# Patient Record
Sex: Male | Born: 1950
Health system: Southern US, Community
[De-identification: ages and names within clinical notes are randomized; demographics above are authoritative.]

## PROBLEM LIST (undated history)

## (undated) DIAGNOSIS — E785 Hyperlipidemia, unspecified: Secondary | ICD-10-CM

## (undated) DIAGNOSIS — E119 Type 2 diabetes mellitus without complications: Secondary | ICD-10-CM

## (undated) DIAGNOSIS — K7460 Unspecified cirrhosis of liver: Secondary | ICD-10-CM

## (undated) DIAGNOSIS — Z9889 Other specified postprocedural states: Secondary | ICD-10-CM

## (undated) DIAGNOSIS — E669 Obesity, unspecified: Secondary | ICD-10-CM

## (undated) DIAGNOSIS — E782 Mixed hyperlipidemia: Secondary | ICD-10-CM

## (undated) DIAGNOSIS — N171 Acute kidney failure with acute cortical necrosis: Secondary | ICD-10-CM

## (undated) DIAGNOSIS — Z45018 Encounter for adjustment and management of other part of cardiac pacemaker: Secondary | ICD-10-CM

## (undated) DIAGNOSIS — I252 Old myocardial infarction: Secondary | ICD-10-CM

## (undated) DIAGNOSIS — I10 Essential (primary) hypertension: Secondary | ICD-10-CM

## (undated) DIAGNOSIS — Z79899 Other long term (current) drug therapy: Secondary | ICD-10-CM

## (undated) DIAGNOSIS — I442 Atrioventricular block, complete: Secondary | ICD-10-CM

## (undated) DIAGNOSIS — K259 Gastric ulcer, unspecified as acute or chronic, without hemorrhage or perforation: Secondary | ICD-10-CM

## (undated) DIAGNOSIS — Z950 Presence of cardiac pacemaker: Secondary | ICD-10-CM

## (undated) DIAGNOSIS — I739 Peripheral vascular disease, unspecified: Secondary | ICD-10-CM

## (undated) DIAGNOSIS — I471 Supraventricular tachycardia: Secondary | ICD-10-CM

## (undated) DIAGNOSIS — E1142 Type 2 diabetes mellitus with diabetic polyneuropathy: Secondary | ICD-10-CM

## (undated) DIAGNOSIS — Z955 Presence of coronary angioplasty implant and graft: Secondary | ICD-10-CM

## (undated) DIAGNOSIS — A0472 Enterocolitis due to Clostridium difficile, not specified as recurrent: Secondary | ICD-10-CM

## (undated) DIAGNOSIS — M1990 Unspecified osteoarthritis, unspecified site: Secondary | ICD-10-CM

## (undated) DIAGNOSIS — R0602 Shortness of breath: Secondary | ICD-10-CM

## (undated) DIAGNOSIS — I2510 Atherosclerotic heart disease of native coronary artery without angina pectoris: Secondary | ICD-10-CM

## (undated) HISTORY — DX: Old myocardial infarction: I25.2

## (undated) HISTORY — DX: Mixed hyperlipidemia: E78.2

## (undated) HISTORY — DX: Supraventricular tachycardia: I47.1

## (undated) HISTORY — DX: Atherosclerotic heart disease of native coronary artery without angina pectoris: I25.10

## (undated) HISTORY — DX: Peripheral vascular disease, unspecified: I73.9

## (undated) HISTORY — PX: ABLATION OF DYSRHYTHMIC FOCUS: SHX254

## (undated) HISTORY — DX: Unspecified osteoarthritis, unspecified site: M19.90

## (undated) HISTORY — DX: Presence of coronary angioplasty implant and graft: Z95.5

## (undated) HISTORY — DX: Gastric ulcer, unspecified as acute or chronic, without hemorrhage or perforation: K25.9

## (undated) HISTORY — DX: Atrioventricular block, complete: I44.2

## (undated) HISTORY — DX: Shortness of breath: R06.02

## (undated) HISTORY — DX: Type 2 diabetes mellitus with diabetic polyneuropathy: E11.42

## (undated) HISTORY — PX: CORONARY ANGIOPLASTY WITH STENT PLACEMENT: SHX49

## (undated) HISTORY — DX: Essential (primary) hypertension: I10

## (undated) HISTORY — DX: Unspecified cirrhosis of liver: K74.60

## (undated) HISTORY — PX: CHOLECYSTECTOMY: SHX55

## (undated) HISTORY — DX: Other specified postprocedural states: Z98.890

## (undated) HISTORY — DX: Encounter for adjustment and management of other part of cardiac pacemaker: Z45.018

## (undated) HISTORY — DX: Acute kidney failure with acute cortical necrosis: N17.1

## (undated) HISTORY — DX: Obesity, unspecified: E66.9

## (undated) HISTORY — PX: TONSILLECTOMY: SUR1361

## (undated) HISTORY — DX: Type 2 diabetes mellitus without complications: E11.9

## (undated) HISTORY — DX: Hyperlipidemia, unspecified: E78.5

## (undated) HISTORY — DX: Presence of cardiac pacemaker: Z95.0

## (undated) HISTORY — DX: Enterocolitis due to Clostridium difficile, not specified as recurrent: A04.72

## (undated) HISTORY — DX: Other long term (current) drug therapy: Z79.899

---

## 1994-04-03 DIAGNOSIS — I252 Old myocardial infarction: Secondary | ICD-10-CM

## 1994-04-03 HISTORY — DX: Old myocardial infarction: I25.2

## 2015-04-19 DIAGNOSIS — I471 Supraventricular tachycardia, unspecified: Secondary | ICD-10-CM

## 2015-04-19 DIAGNOSIS — I739 Peripheral vascular disease, unspecified: Secondary | ICD-10-CM

## 2015-04-19 DIAGNOSIS — I2510 Atherosclerotic heart disease of native coronary artery without angina pectoris: Secondary | ICD-10-CM

## 2015-04-19 DIAGNOSIS — E785 Hyperlipidemia, unspecified: Secondary | ICD-10-CM

## 2015-04-19 HISTORY — DX: Peripheral vascular disease, unspecified: I73.9

## 2015-04-19 HISTORY — DX: Supraventricular tachycardia, unspecified: I47.10

## 2015-04-19 HISTORY — DX: Hyperlipidemia, unspecified: E78.5

## 2015-04-19 HISTORY — DX: Atherosclerotic heart disease of native coronary artery without angina pectoris: I25.10

## 2015-04-19 HISTORY — DX: Supraventricular tachycardia: I47.1

## 2015-05-02 DIAGNOSIS — E119 Type 2 diabetes mellitus without complications: Secondary | ICD-10-CM

## 2015-05-02 HISTORY — DX: Type 2 diabetes mellitus without complications: E11.9

## 2015-06-08 DIAGNOSIS — Z950 Presence of cardiac pacemaker: Secondary | ICD-10-CM

## 2015-06-08 HISTORY — DX: Presence of cardiac pacemaker: Z95.0

## 2015-06-09 DIAGNOSIS — Z9889 Other specified postprocedural states: Secondary | ICD-10-CM

## 2015-06-09 HISTORY — PX: ELECTROPHYSIOLOGIC STUDY: SHX172A

## 2015-06-09 HISTORY — DX: Other specified postprocedural states: Z98.890

## 2015-06-14 DIAGNOSIS — Z950 Presence of cardiac pacemaker: Secondary | ICD-10-CM

## 2015-06-14 HISTORY — DX: Presence of cardiac pacemaker: Z95.0

## 2015-06-14 HISTORY — PX: ATRIAL TACH ABLATION: EP1192

## 2015-06-28 DIAGNOSIS — I442 Atrioventricular block, complete: Secondary | ICD-10-CM

## 2015-06-28 DIAGNOSIS — Z45018 Encounter for adjustment and management of other part of cardiac pacemaker: Secondary | ICD-10-CM

## 2015-06-28 HISTORY — DX: Atrioventricular block, complete: I44.2

## 2015-06-28 HISTORY — DX: Encounter for adjustment and management of other part of cardiac pacemaker: Z45.018

## 2015-09-09 DIAGNOSIS — Z9889 Other specified postprocedural states: Secondary | ICD-10-CM | POA: Diagnosis not present

## 2015-09-09 DIAGNOSIS — I442 Atrioventricular block, complete: Secondary | ICD-10-CM | POA: Diagnosis not present

## 2015-09-09 DIAGNOSIS — Z45018 Encounter for adjustment and management of other part of cardiac pacemaker: Secondary | ICD-10-CM | POA: Diagnosis not present

## 2015-09-09 DIAGNOSIS — I2510 Atherosclerotic heart disease of native coronary artery without angina pectoris: Secondary | ICD-10-CM | POA: Diagnosis not present

## 2015-09-09 DIAGNOSIS — I471 Supraventricular tachycardia: Secondary | ICD-10-CM | POA: Diagnosis not present

## 2015-09-09 DIAGNOSIS — Z950 Presence of cardiac pacemaker: Secondary | ICD-10-CM | POA: Diagnosis not present

## 2015-10-29 DIAGNOSIS — I442 Atrioventricular block, complete: Secondary | ICD-10-CM | POA: Diagnosis not present

## 2015-10-29 DIAGNOSIS — Z45018 Encounter for adjustment and management of other part of cardiac pacemaker: Secondary | ICD-10-CM | POA: Diagnosis not present

## 2015-12-09 ENCOUNTER — Other Ambulatory Visit: Payer: Self-pay | Admitting: Unknown Physician Specialty

## 2015-12-09 DIAGNOSIS — K7460 Unspecified cirrhosis of liver: Secondary | ICD-10-CM

## 2015-12-16 ENCOUNTER — Ambulatory Visit
Admission: RE | Admit: 2015-12-16 | Discharge: 2015-12-16 | Disposition: A | Discharge status: Home / Self Care | Payer: PPO | Source: Ambulatory Visit | Attending: Unknown Physician Specialty | Admitting: Unknown Physician Specialty

## 2015-12-16 DIAGNOSIS — K7460 Unspecified cirrhosis of liver: Secondary | ICD-10-CM

## 2015-12-16 DIAGNOSIS — K7689 Other specified diseases of liver: Secondary | ICD-10-CM | POA: Diagnosis not present

## 2015-12-17 DIAGNOSIS — K7460 Unspecified cirrhosis of liver: Secondary | ICD-10-CM | POA: Diagnosis not present

## 2016-01-27 DIAGNOSIS — Z950 Presence of cardiac pacemaker: Secondary | ICD-10-CM | POA: Diagnosis not present

## 2016-02-03 DIAGNOSIS — K7460 Unspecified cirrhosis of liver: Secondary | ICD-10-CM | POA: Diagnosis not present

## 2016-02-03 DIAGNOSIS — R197 Diarrhea, unspecified: Secondary | ICD-10-CM | POA: Diagnosis not present

## 2016-02-07 DIAGNOSIS — R197 Diarrhea, unspecified: Secondary | ICD-10-CM | POA: Diagnosis not present

## 2016-02-07 DIAGNOSIS — K7460 Unspecified cirrhosis of liver: Secondary | ICD-10-CM | POA: Diagnosis not present

## 2016-02-10 DIAGNOSIS — R197 Diarrhea, unspecified: Secondary | ICD-10-CM | POA: Diagnosis not present

## 2016-03-01 DIAGNOSIS — R5381 Other malaise: Secondary | ICD-10-CM | POA: Diagnosis not present

## 2016-03-01 DIAGNOSIS — I471 Supraventricular tachycardia: Secondary | ICD-10-CM | POA: Diagnosis not present

## 2016-03-01 DIAGNOSIS — I2510 Atherosclerotic heart disease of native coronary artery without angina pectoris: Secondary | ICD-10-CM | POA: Diagnosis not present

## 2016-03-01 DIAGNOSIS — Z950 Presence of cardiac pacemaker: Secondary | ICD-10-CM | POA: Diagnosis not present

## 2016-03-01 DIAGNOSIS — E785 Hyperlipidemia, unspecified: Secondary | ICD-10-CM | POA: Diagnosis not present

## 2016-03-01 DIAGNOSIS — Z79899 Other long term (current) drug therapy: Secondary | ICD-10-CM

## 2016-03-01 HISTORY — DX: Other long term (current) drug therapy: Z79.899

## 2016-03-02 DIAGNOSIS — R197 Diarrhea, unspecified: Secondary | ICD-10-CM | POA: Diagnosis not present

## 2016-03-02 DIAGNOSIS — A047 Enterocolitis due to Clostridium difficile: Secondary | ICD-10-CM | POA: Diagnosis not present

## 2016-03-02 DIAGNOSIS — Z1211 Encounter for screening for malignant neoplasm of colon: Secondary | ICD-10-CM | POA: Diagnosis not present

## 2016-03-20 DIAGNOSIS — M109 Gout, unspecified: Secondary | ICD-10-CM | POA: Diagnosis not present

## 2016-04-27 DIAGNOSIS — Z9889 Other specified postprocedural states: Secondary | ICD-10-CM | POA: Diagnosis not present

## 2016-04-27 DIAGNOSIS — I471 Supraventricular tachycardia: Secondary | ICD-10-CM | POA: Diagnosis not present

## 2016-04-27 DIAGNOSIS — I739 Peripheral vascular disease, unspecified: Secondary | ICD-10-CM | POA: Diagnosis not present

## 2016-04-27 DIAGNOSIS — E119 Type 2 diabetes mellitus without complications: Secondary | ICD-10-CM | POA: Diagnosis not present

## 2016-04-27 DIAGNOSIS — Z45018 Encounter for adjustment and management of other part of cardiac pacemaker: Secondary | ICD-10-CM | POA: Diagnosis not present

## 2016-04-27 DIAGNOSIS — Z794 Long term (current) use of insulin: Secondary | ICD-10-CM | POA: Diagnosis not present

## 2016-04-27 DIAGNOSIS — I442 Atrioventricular block, complete: Secondary | ICD-10-CM | POA: Diagnosis not present

## 2016-04-29 DIAGNOSIS — Z45018 Encounter for adjustment and management of other part of cardiac pacemaker: Secondary | ICD-10-CM | POA: Diagnosis not present

## 2016-04-29 DIAGNOSIS — Z950 Presence of cardiac pacemaker: Secondary | ICD-10-CM | POA: Diagnosis not present

## 2016-05-01 DIAGNOSIS — R197 Diarrhea, unspecified: Secondary | ICD-10-CM | POA: Diagnosis not present

## 2016-05-01 DIAGNOSIS — K2950 Unspecified chronic gastritis without bleeding: Secondary | ICD-10-CM | POA: Diagnosis not present

## 2016-05-01 DIAGNOSIS — Z1211 Encounter for screening for malignant neoplasm of colon: Secondary | ICD-10-CM | POA: Diagnosis not present

## 2016-05-01 DIAGNOSIS — K2900 Acute gastritis without bleeding: Secondary | ICD-10-CM | POA: Diagnosis not present

## 2016-05-01 DIAGNOSIS — K7460 Unspecified cirrhosis of liver: Secondary | ICD-10-CM | POA: Diagnosis not present

## 2016-05-01 DIAGNOSIS — Z791 Long term (current) use of non-steroidal anti-inflammatories (NSAID): Secondary | ICD-10-CM | POA: Diagnosis not present

## 2016-05-19 DIAGNOSIS — Z01818 Encounter for other preprocedural examination: Secondary | ICD-10-CM | POA: Diagnosis not present

## 2016-05-19 DIAGNOSIS — Z950 Presence of cardiac pacemaker: Secondary | ICD-10-CM | POA: Diagnosis not present

## 2016-05-19 DIAGNOSIS — I471 Supraventricular tachycardia: Secondary | ICD-10-CM | POA: Diagnosis not present

## 2016-05-24 DIAGNOSIS — Z7984 Long term (current) use of oral hypoglycemic drugs: Secondary | ICD-10-CM | POA: Diagnosis not present

## 2016-05-24 DIAGNOSIS — E1142 Type 2 diabetes mellitus with diabetic polyneuropathy: Secondary | ICD-10-CM | POA: Diagnosis not present

## 2016-05-24 DIAGNOSIS — I471 Supraventricular tachycardia: Secondary | ICD-10-CM | POA: Diagnosis not present

## 2016-05-24 DIAGNOSIS — Z6834 Body mass index (BMI) 34.0-34.9, adult: Secondary | ICD-10-CM | POA: Diagnosis not present

## 2016-05-24 DIAGNOSIS — R002 Palpitations: Secondary | ICD-10-CM | POA: Diagnosis not present

## 2016-05-24 DIAGNOSIS — E1151 Type 2 diabetes mellitus with diabetic peripheral angiopathy without gangrene: Secondary | ICD-10-CM | POA: Diagnosis not present

## 2016-05-24 DIAGNOSIS — E785 Hyperlipidemia, unspecified: Secondary | ICD-10-CM | POA: Diagnosis not present

## 2016-05-24 DIAGNOSIS — R5383 Other fatigue: Secondary | ICD-10-CM | POA: Diagnosis not present

## 2016-05-24 DIAGNOSIS — I2510 Atherosclerotic heart disease of native coronary artery without angina pectoris: Secondary | ICD-10-CM | POA: Diagnosis not present

## 2016-05-24 DIAGNOSIS — R000 Tachycardia, unspecified: Secondary | ICD-10-CM | POA: Diagnosis not present

## 2016-05-24 DIAGNOSIS — Z7902 Long term (current) use of antithrombotics/antiplatelets: Secondary | ICD-10-CM | POA: Diagnosis not present

## 2016-05-24 DIAGNOSIS — E669 Obesity, unspecified: Secondary | ICD-10-CM | POA: Diagnosis not present

## 2016-05-24 DIAGNOSIS — I252 Old myocardial infarction: Secondary | ICD-10-CM | POA: Diagnosis not present

## 2016-05-24 DIAGNOSIS — Z7982 Long term (current) use of aspirin: Secondary | ICD-10-CM | POA: Diagnosis not present

## 2016-05-24 DIAGNOSIS — Z87891 Personal history of nicotine dependence: Secondary | ICD-10-CM | POA: Diagnosis not present

## 2016-05-24 DIAGNOSIS — Z950 Presence of cardiac pacemaker: Secondary | ICD-10-CM | POA: Diagnosis not present

## 2016-05-24 DIAGNOSIS — Z955 Presence of coronary angioplasty implant and graft: Secondary | ICD-10-CM | POA: Diagnosis not present

## 2016-05-24 HISTORY — PX: SUPRAVENTRICULAR TACHYCARDIA ABLATION: SHX6106

## 2016-05-25 DIAGNOSIS — Z950 Presence of cardiac pacemaker: Secondary | ICD-10-CM | POA: Diagnosis not present

## 2016-05-25 DIAGNOSIS — I739 Peripheral vascular disease, unspecified: Secondary | ICD-10-CM | POA: Diagnosis not present

## 2016-05-25 DIAGNOSIS — I471 Supraventricular tachycardia: Secondary | ICD-10-CM | POA: Diagnosis not present

## 2016-05-25 DIAGNOSIS — E119 Type 2 diabetes mellitus without complications: Secondary | ICD-10-CM | POA: Diagnosis not present

## 2016-06-12 DIAGNOSIS — K7460 Unspecified cirrhosis of liver: Secondary | ICD-10-CM | POA: Diagnosis not present

## 2016-07-03 DIAGNOSIS — Z45018 Encounter for adjustment and management of other part of cardiac pacemaker: Secondary | ICD-10-CM | POA: Diagnosis not present

## 2016-07-03 DIAGNOSIS — I2510 Atherosclerotic heart disease of native coronary artery without angina pectoris: Secondary | ICD-10-CM | POA: Diagnosis not present

## 2016-07-03 DIAGNOSIS — Z9889 Other specified postprocedural states: Secondary | ICD-10-CM | POA: Diagnosis not present

## 2016-07-03 DIAGNOSIS — I471 Supraventricular tachycardia: Secondary | ICD-10-CM | POA: Diagnosis not present

## 2016-08-01 DIAGNOSIS — Z950 Presence of cardiac pacemaker: Secondary | ICD-10-CM | POA: Diagnosis not present

## 2016-08-03 ENCOUNTER — Other Ambulatory Visit: Payer: Self-pay | Admitting: Unknown Physician Specialty

## 2016-08-03 DIAGNOSIS — K2901 Acute gastritis with bleeding: Secondary | ICD-10-CM | POA: Diagnosis not present

## 2016-08-03 DIAGNOSIS — K7460 Unspecified cirrhosis of liver: Secondary | ICD-10-CM | POA: Diagnosis not present

## 2016-08-11 ENCOUNTER — Other Ambulatory Visit: Payer: PPO

## 2016-08-12 DIAGNOSIS — J0100 Acute maxillary sinusitis, unspecified: Secondary | ICD-10-CM | POA: Diagnosis not present

## 2016-08-25 ENCOUNTER — Other Ambulatory Visit: Payer: PPO

## 2016-08-30 ENCOUNTER — Ambulatory Visit
Admission: RE | Admit: 2016-08-30 | Discharge: 2016-08-30 | Disposition: A | Discharge status: Home / Self Care | Payer: PPO | Source: Ambulatory Visit | Attending: Unknown Physician Specialty | Admitting: Unknown Physician Specialty

## 2016-08-30 DIAGNOSIS — K7460 Unspecified cirrhosis of liver: Secondary | ICD-10-CM

## 2016-09-06 DIAGNOSIS — I10 Essential (primary) hypertension: Secondary | ICD-10-CM | POA: Diagnosis not present

## 2016-09-06 DIAGNOSIS — I2510 Atherosclerotic heart disease of native coronary artery without angina pectoris: Secondary | ICD-10-CM | POA: Diagnosis not present

## 2016-09-06 DIAGNOSIS — Z950 Presence of cardiac pacemaker: Secondary | ICD-10-CM | POA: Diagnosis not present

## 2016-09-06 DIAGNOSIS — Z9889 Other specified postprocedural states: Secondary | ICD-10-CM | POA: Diagnosis not present

## 2016-09-06 DIAGNOSIS — Z79899 Other long term (current) drug therapy: Secondary | ICD-10-CM | POA: Diagnosis not present

## 2016-10-06 DIAGNOSIS — I10 Essential (primary) hypertension: Secondary | ICD-10-CM

## 2016-10-06 DIAGNOSIS — E782 Mixed hyperlipidemia: Secondary | ICD-10-CM | POA: Insufficient documentation

## 2016-10-06 DIAGNOSIS — E119 Type 2 diabetes mellitus without complications: Secondary | ICD-10-CM | POA: Diagnosis not present

## 2016-10-06 HISTORY — DX: Mixed hyperlipidemia: E78.2

## 2016-10-06 HISTORY — DX: Essential (primary) hypertension: I10

## 2016-11-01 DIAGNOSIS — I442 Atrioventricular block, complete: Secondary | ICD-10-CM | POA: Diagnosis not present

## 2016-11-01 DIAGNOSIS — Z45018 Encounter for adjustment and management of other part of cardiac pacemaker: Secondary | ICD-10-CM | POA: Diagnosis not present

## 2017-01-10 DIAGNOSIS — B352 Tinea manuum: Secondary | ICD-10-CM | POA: Diagnosis not present

## 2017-01-10 DIAGNOSIS — B356 Tinea cruris: Secondary | ICD-10-CM | POA: Diagnosis not present

## 2017-01-10 DIAGNOSIS — D1801 Hemangioma of skin and subcutaneous tissue: Secondary | ICD-10-CM | POA: Diagnosis not present

## 2017-01-10 DIAGNOSIS — L821 Other seborrheic keratosis: Secondary | ICD-10-CM | POA: Diagnosis not present

## 2017-01-31 DIAGNOSIS — Z950 Presence of cardiac pacemaker: Secondary | ICD-10-CM | POA: Diagnosis not present

## 2017-02-12 IMAGING — US US ABDOMEN COMPLETE
1 series · 14 of 25 positions shown · non-contrast
Comparison: CT abdomen pelvis of 06/23/2011

CLINICAL DATA: History of non alcoholic cirrhosis, prior
cholecystectomy

EXAM:
ABDOMEN ULTRASOUND COMPLETE

[Series 1: us abdomen complete · 0.35mm/px · 14 of 65 slices shown]
[im 1/65]
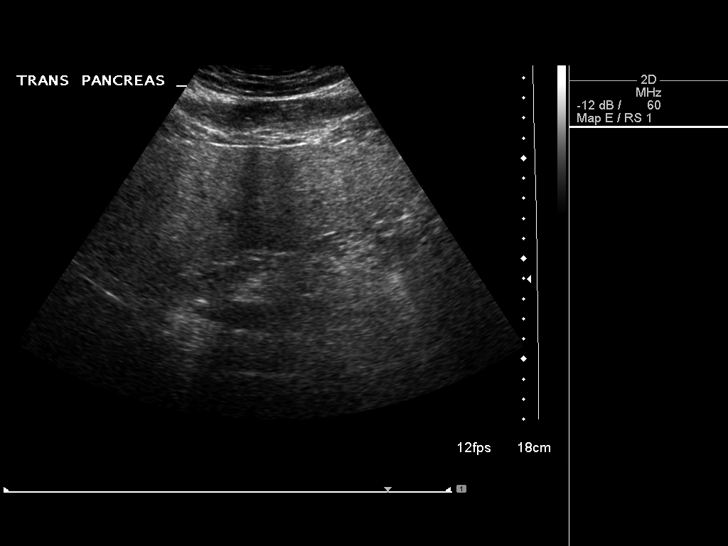
[im 6/65]
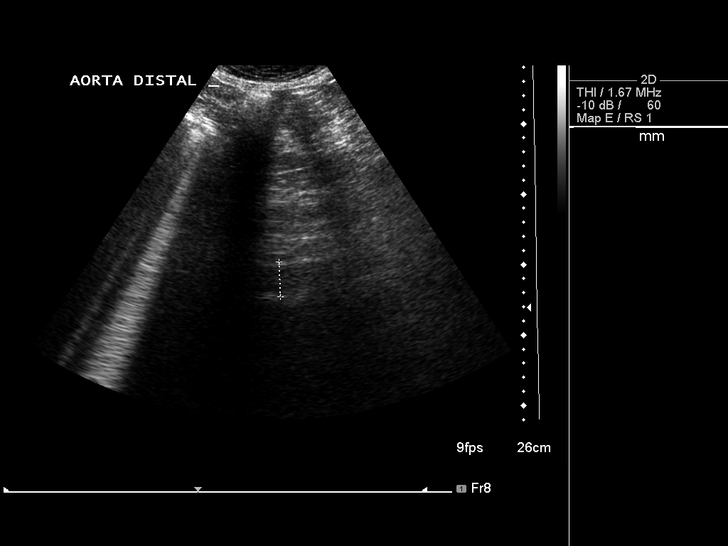
[im 11/65]
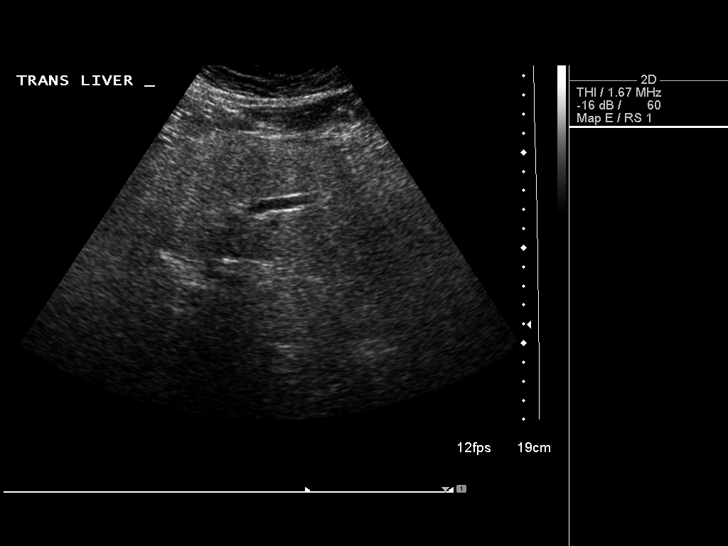
[im 17/65]
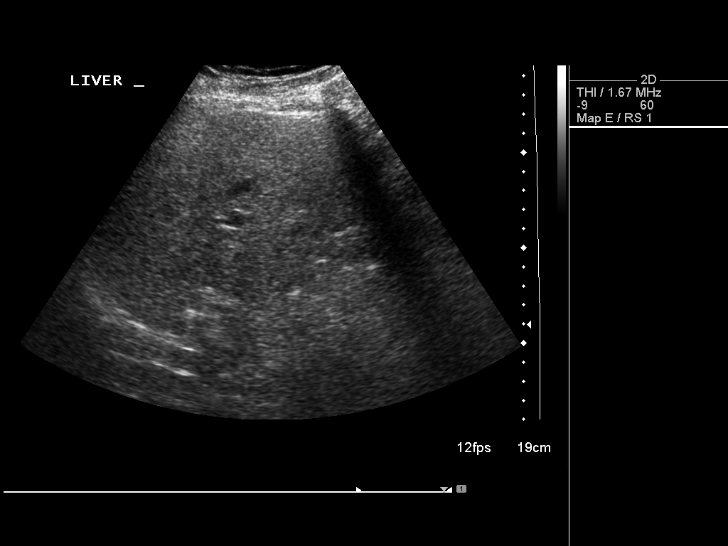
[im 22/65]
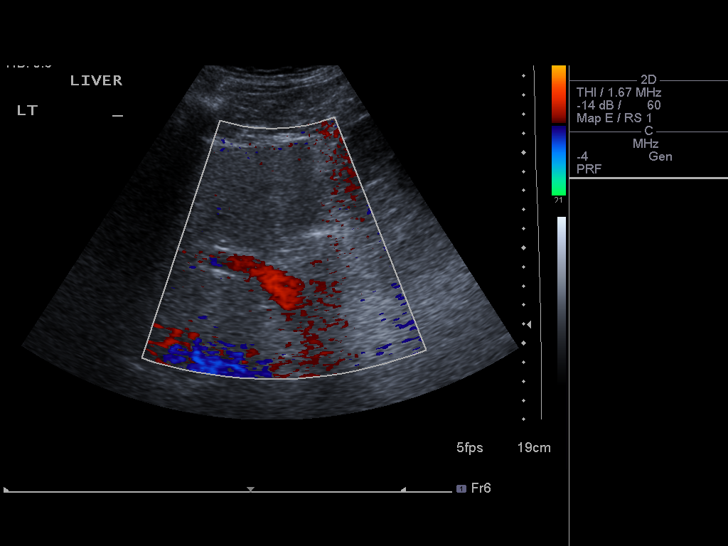
[im 25/65]
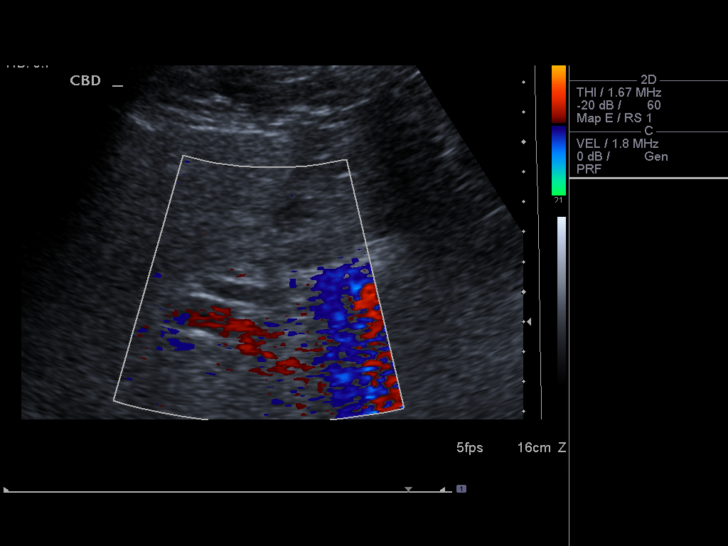
[im 30/65]
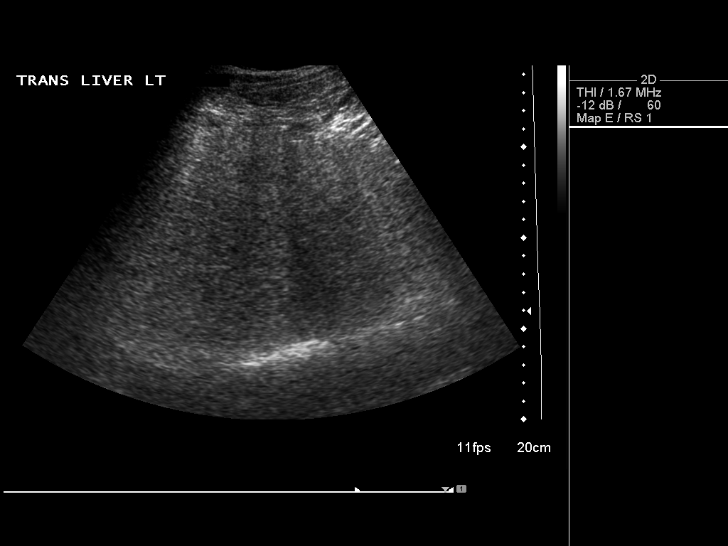
[im 35/65]
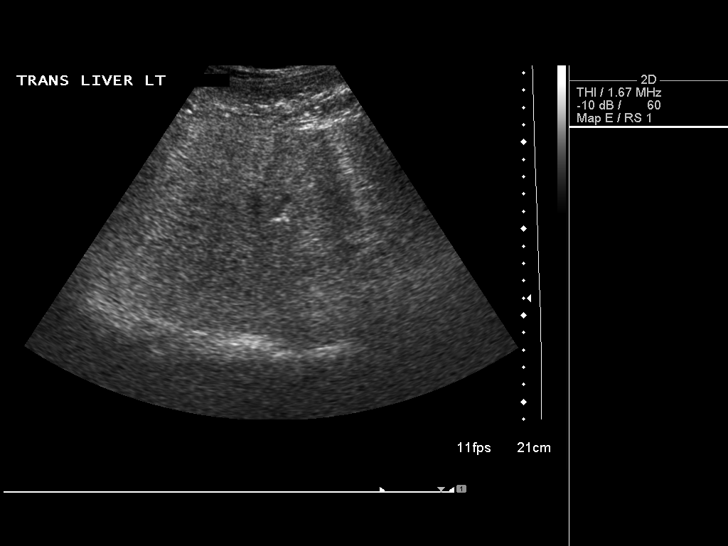
[im 41/65]
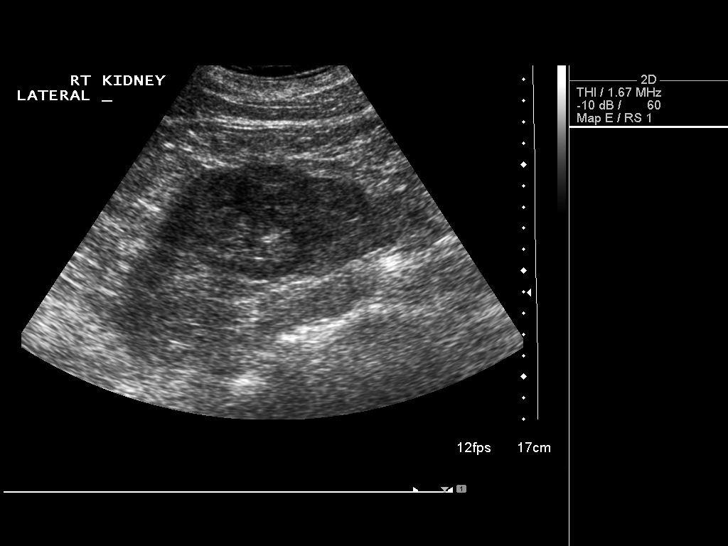
[im 43/65]
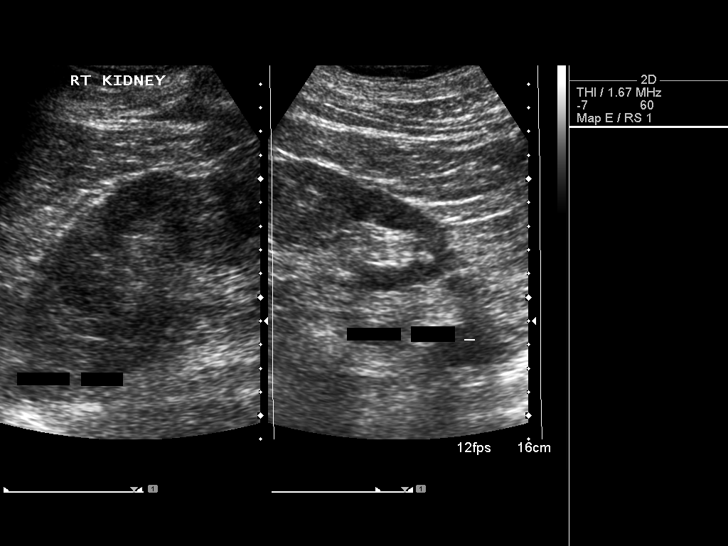
[im 49/65]
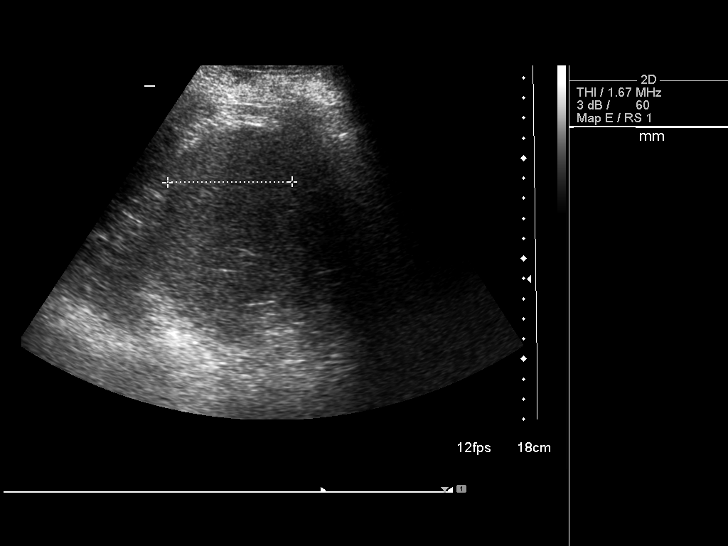
[im 54/65]
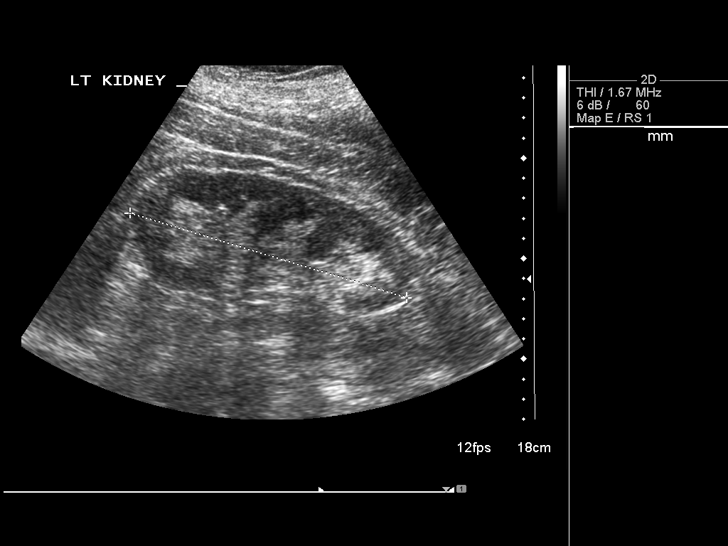
[im 59/65]
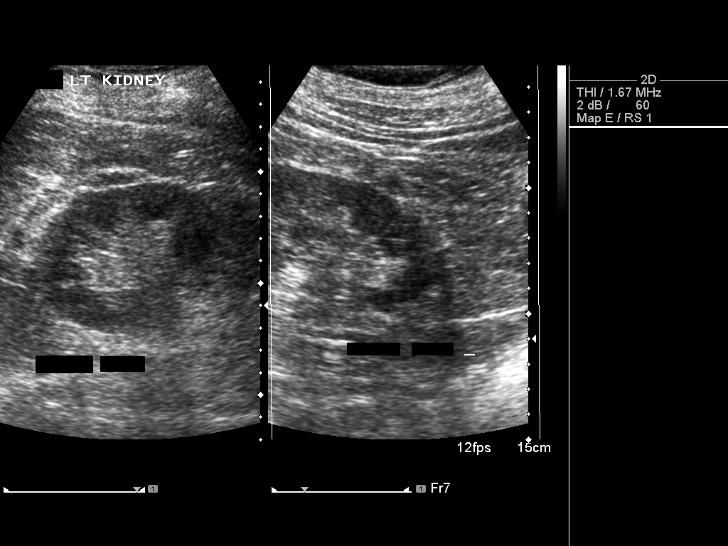
[im 65/65]
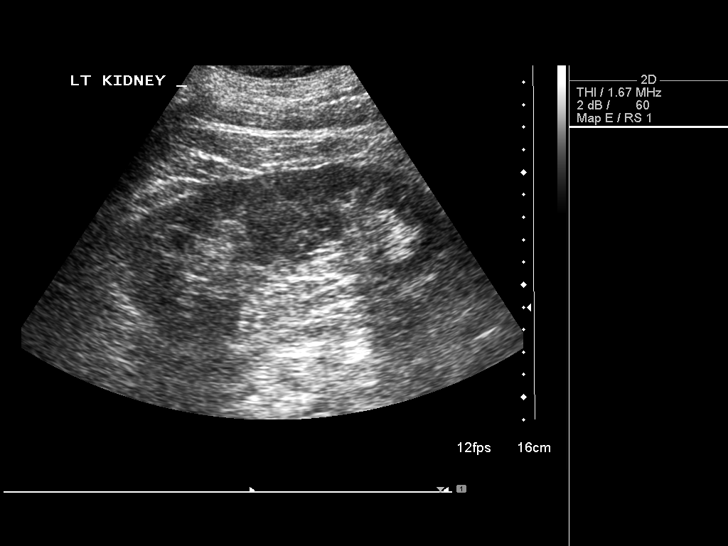

[14 of 25 positions shown; findings below may reference images not displayed]

FINDINGS: Gallbladder: The gallbladder has previously been resected.

Common bile duct: Diameter: The common bile duct is normal measuring
5.5 mm.

Liver: The liver is inhomogeneous and echogenic with nodular
contours consistent with changes of cirrhosis. No focal hepatic
abnormality is seen.

IVC: The IVC is largely obscured by bowel gas.

Pancreas: Much of the pancreas also is obscured by bowel gas and
body habitus.

Spleen: The spleen is normal measuring 6.2 cm.

Right Kidney: Length: 15.2 cm..  No hydronephrosis is seen.

Left Kidney: Length: 14.4 cm..  No hydronephrosis is noted.

Abdominal aorta: The abdominal aorta is largely obscured by bowel
gas and body habitus.

Other findings: None.
IMPRESSION: 1. Inhomogeneous echogenic liver parenchyma with nodular contours
consistent with changes of cirrhosis. No focal hepatic abnormality
is seen.
2. Much of the pancreas and abdominal aorta is obscured by bowel
gas.
3. No hydronephrosis.

## 2017-03-10 DIAGNOSIS — L122 Chronic bullous disease of childhood: Secondary | ICD-10-CM | POA: Diagnosis not present

## 2017-03-10 DIAGNOSIS — J029 Acute pharyngitis, unspecified: Secondary | ICD-10-CM | POA: Diagnosis not present

## 2017-04-11 ENCOUNTER — Other Ambulatory Visit: Payer: Self-pay | Admitting: Cardiology

## 2017-04-17 DIAGNOSIS — E119 Type 2 diabetes mellitus without complications: Secondary | ICD-10-CM | POA: Diagnosis not present

## 2017-04-17 DIAGNOSIS — E782 Mixed hyperlipidemia: Secondary | ICD-10-CM | POA: Diagnosis not present

## 2017-04-25 DIAGNOSIS — M1990 Unspecified osteoarthritis, unspecified site: Secondary | ICD-10-CM

## 2017-04-25 DIAGNOSIS — N171 Acute kidney failure with acute cortical necrosis: Secondary | ICD-10-CM

## 2017-04-25 DIAGNOSIS — A0472 Enterocolitis due to Clostridium difficile, not specified as recurrent: Secondary | ICD-10-CM | POA: Insufficient documentation

## 2017-04-25 DIAGNOSIS — Z955 Presence of coronary angioplasty implant and graft: Secondary | ICD-10-CM

## 2017-04-25 DIAGNOSIS — I10 Essential (primary) hypertension: Secondary | ICD-10-CM

## 2017-04-25 DIAGNOSIS — R0602 Shortness of breath: Secondary | ICD-10-CM

## 2017-04-25 DIAGNOSIS — K259 Gastric ulcer, unspecified as acute or chronic, without hemorrhage or perforation: Secondary | ICD-10-CM

## 2017-04-25 DIAGNOSIS — E1142 Type 2 diabetes mellitus with diabetic polyneuropathy: Secondary | ICD-10-CM

## 2017-04-25 DIAGNOSIS — E669 Obesity, unspecified: Secondary | ICD-10-CM

## 2017-04-25 DIAGNOSIS — K7460 Unspecified cirrhosis of liver: Secondary | ICD-10-CM

## 2017-04-25 HISTORY — DX: Unspecified osteoarthritis, unspecified site: M19.90

## 2017-04-25 HISTORY — DX: Gastric ulcer, unspecified as acute or chronic, without hemorrhage or perforation: K25.9

## 2017-04-25 HISTORY — DX: Shortness of breath: R06.02

## 2017-04-25 HISTORY — DX: Essential (primary) hypertension: I10

## 2017-04-25 HISTORY — DX: Presence of coronary angioplasty implant and graft: Z95.5

## 2017-04-25 HISTORY — DX: Type 2 diabetes mellitus with diabetic polyneuropathy: E11.42

## 2017-04-25 HISTORY — DX: Acute kidney failure with acute cortical necrosis: N17.1

## 2017-04-25 HISTORY — DX: Unspecified cirrhosis of liver: K74.60

## 2017-04-25 HISTORY — DX: Obesity, unspecified: E66.9

## 2017-04-25 HISTORY — DX: Enterocolitis due to Clostridium difficile, not specified as recurrent: A04.72

## 2017-05-02 DIAGNOSIS — Z950 Presence of cardiac pacemaker: Secondary | ICD-10-CM | POA: Diagnosis not present

## 2017-05-29 ENCOUNTER — Other Ambulatory Visit: Payer: Self-pay | Admitting: *Deleted

## 2017-06-18 ENCOUNTER — Ambulatory Visit: Payer: PPO | Admitting: Cardiology

## 2017-06-18 ENCOUNTER — Encounter: Payer: Self-pay | Admitting: Cardiology

## 2017-06-18 VITALS — BP 130/70 | HR 72 | Ht 73.0 in | Wt 240.0 lb

## 2017-06-18 DIAGNOSIS — E785 Hyperlipidemia, unspecified: Secondary | ICD-10-CM | POA: Diagnosis not present

## 2017-06-18 DIAGNOSIS — I739 Peripheral vascular disease, unspecified: Secondary | ICD-10-CM

## 2017-06-18 DIAGNOSIS — I10 Essential (primary) hypertension: Secondary | ICD-10-CM | POA: Diagnosis not present

## 2017-06-18 DIAGNOSIS — Z79899 Other long term (current) drug therapy: Secondary | ICD-10-CM | POA: Diagnosis not present

## 2017-06-18 DIAGNOSIS — I25118 Atherosclerotic heart disease of native coronary artery with other forms of angina pectoris: Secondary | ICD-10-CM

## 2017-06-18 DIAGNOSIS — Z950 Presence of cardiac pacemaker: Secondary | ICD-10-CM | POA: Diagnosis not present

## 2017-06-18 DIAGNOSIS — I471 Supraventricular tachycardia: Secondary | ICD-10-CM | POA: Diagnosis not present

## 2017-06-18 MED ORDER — CLOPIDOGREL BISULFATE 75 MG PO TABS: 75.0000 mg | ORAL_TABLET | Freq: Every day | ORAL | 3 refills | Status: DC

## 2017-06-18 MED ORDER — SOTALOL HCL 80 MG PO TABS: 80.0000 mg | ORAL_TABLET | Freq: Two times a day (BID) | ORAL | 3 refills | Status: DC

## 2017-06-18 MED ORDER — LISINOPRIL 10 MG PO TABS: 10.0000 mg | ORAL_TABLET | Freq: Every day | ORAL | 3 refills | Status: DC

## 2017-06-18 NOTE — Patient Instructions (Signed)
Medication Instructions:  Your physician recommends that you continue on your current medications as directed. Please refer to the Current Medication list given to you today.  Labwork: Your physician recommends that you return for lab work in: today. Lipid.  Testing/Procedures: None  Follow-Up: Your physician wants you to follow-up in: 1 year. You will receive a reminder letter in the mail two months in advance. If you don't receive a letter, please call our office to schedule the follow-up appointment.  Any Other Special Instructions Will Be Listed Below (If Applicable).     If you need a refill on your cardiac medications before your next appointment, please call your pharmacy.

## 2017-06-18 NOTE — Progress Notes (Signed)
Cardiology Office Note:    Date:  06/18/2017   ID:  Charles Gonzalez, DOB 1951-04-06, MRN [id number]  PCP:  Algis Greenhouse, MD  Cardiologist:  Shirlee More, MD    Referring MD: No ref. provider found    ASSESSMENT:    1. Coronary artery disease of native artery of native heart with stable angina pectoris ([place])   2. PSVT (paroxysmal supraventricular tachycardia) ([place])   3. High risk medication use   4. Presence of permanent cardiac pacemaker   5. Hyperlipidemia, unspecified hyperlipidemia type   6. Essential hypertension   7. PAD (peripheral artery disease) ([facility])    PLAN:    In order of problems listed above:  1. Stable,  2. Stable after EP catheter ablation continue low-dose sotalol for its beta-blocker effect QT interval is normal and EKG 3. Stable continue low-dose sotalol, at this dose it is predominantly beta-blocker is clinically effective 4. Stable function, he understands that his EP doctors in a different practice will continue pacemaker follow-up and management through my former group [facility] 5. Poorly controlled if agreeable start therapy with zetia if LDL remains greater than 70 add WelChol he will have a lipid profile performed today 6. Stable continue ACE inhibitor 7. Stable asymptomatic at this time I do not think he requires any imaging or diagnostic studies for his vascular disease peripheral   Next appointment: One year   Medication Adjustments/Labs and Tests Ordered: Current medicines are reviewed at length with the patient today.  Concerns regarding medicines are outlined above.  No orders of the defined types were placed in this encounter.  No orders of the defined types were placed in this encounter.   Chief Complaint  Patient presents with  . Follow-up    SVT and pacemaker  . Coronary Artery Disease    History of Present Illness:    Charles Gonzalez is a 66 y.o. male with a hx of CAD, SVT with bypass tract ablation 06/08/16 and on low dose sotolol ,  RBBB , Dyslipidemia with statin intolerance , HTN, Peripheral Vascular Disease with PCI of the LCIA in 2001 and a pacemaker  last seen in July 2017.Marland Kitchen Compliance with diet, lifestyle and medications: Yes Overall he is pleased with the quality of his life is not having claudication exercise intolerance chest pain shortness of breath palpitation syncope or TIA.  I engaged him in a conversation about lipids CAD and PAD and he is adamant he will not except statin therapy is a been intolerant of all statins in the past.  He is agreed to check his lipids and consider alternate therapy his LDL is greater than 100 and if he agrees will be started on Zetia and if his LDL remains above 70 WelChol can be added.  I offered antibody therapy PC SK 9 and he declined Past Medical History:  Diagnosis Date  . Acute kidney failure with acute cortical necrosis ([place]) 04/25/2017  . Arthritis 04/25/2017  . Benign hypertension 10/06/2016  . C. difficile colitis 04/25/2017   02/2016  . CAD (coronary artery disease), native coronary artery 04/19/2015   Overview:  PCI of RCA 1995  . Cirrhosis ([place]) 04/25/2017  . Gastric ulcer 04/25/2017  . High risk medication use 03/01/2016   Overview:  sotolol  . Hyperlipidemia 04/19/2015  . Hypertension 04/25/2017  . Mixed hyperlipidemia 10/06/2016   Overview:  HIS indicated based on CHD, declines statin, felt bad, declines PCSK9i  . Obesity 04/25/2017  . Old MI (myocardial infarction) 04/03/1994  .  Pacemaker 06/08/2015   Overview:  St Jude  . Pacemaker reprogramming/check 06/28/2015  . PAD (peripheral artery disease) ([place]) 04/19/2015   Overview:  PCI of L CIA 2001  . Polyneuropathy in diabetes ([place]) 04/25/2017  . Presence of permanent cardiac pacemaker 06/14/2015  . PSVT (paroxysmal supraventricular tachycardia) ([place]) 04/19/2015  . S/P ablation of accessory bypass tract 06/09/2015  . S/P coronary artery stent placement 04/25/2017   Overview:  Multiple  . Shortness of breath 04/25/2017    Overview:  exertional and with tachycardia  . Transient complete heart block ([place]) 06/28/2015   Overview:  pacemaker  . Type 2 diabetes mellitus without complication, without long-term current use of insulin ([place]) 05/02/2015   Overview:  1998: dx    Past Surgical History:  Procedure Laterality Date  . ABLATION OF DYSRHYTHMIC FOCUS    . ATRIAL TACH ABLATION  06/14/2015   Procedure: Atrial Tachycardia Ablation accessory pathway was close to HIS bundle, we have to approach this differently either from noncoronary cusp or left atrium NAVIX CRYO; Surgeon: Mahala Menghini, MD; Location: [facility] EP; Service: Cardiology  . CHOLECYSTECTOMY    . CORONARY ANGIOPLASTY WITH STENT PLACEMENT    . ELECTROPHYSIOLOGIC STUDY  06/09/2015   Procedure: Comprehensive Study W IND POSS PPM BTK; Surgeon: Mahala Menghini, MD; Location[facility] EP; Service: Cardiology   . SUPRAVENTRICULAR TACHYCARDIA ABLATION  05/24/2016   Procedure: Atrial Tachycardia Ablation esi; Surgeon: Mahala Menghini, MD; Location: [facility] EP; Service: Cardiology   . TONSILLECTOMY      Current Medications: Current Meds  Medication Sig  . aspirin EC 81 MG tablet Take 81 mg by mouth.  . clopidogrel (PLAVIX) 75 MG tablet TAKE ONE TABLET BY MOUTH EVERY DAY  . colchicine 0.6 MG tablet Take 0.6 mg by mouth.  Marland Kitchen glipiZIDE (GLUCOTROL XL) 5 MG 24 hr tablet Take 5 mg by mouth.  Marland Kitchen lisinopril (PRINIVIL,ZESTRIL) 10 MG tablet TAKE ONE TABLET BY MOUTH EVERY DAY  . metFORMIN (GLUCOPHAGE) 500 MG tablet Take 500 mg by mouth.  . sotalol (BETAPACE) 80 MG tablet Take 80 mg 2 (two) times daily by mouth.      Allergies:   Patient has no known allergies.   Social History   Socioeconomic History  . Marital status: Married    Spouse name: None  . Number of children: None  . Years of education: None  . Highest education level: None  Social Needs  . Financial resource strain: None  . Food insecurity - worry: None  . Food insecurity - inability: None  . Transportation needs -  medical: None  . Transportation needs - non-medical: None  Occupational History  . None  Tobacco Use  . Smoking status: Former Smoker    Years: 25.00    Last attempt to quit: 04/19/1994    Years since quitting: 23.1  . Smokeless tobacco: Never Used  Substance and Sexual Activity  . Alcohol use: No  . Drug use: No  . Sexual activity: None  Other Topics Concern  . None  Social History Narrative  . None     Family History: The patient's family history includes CAD in his mother; Hypertension in his father. ROS:   Please see the history of present illness.    All other systems reviewed and are negative.  EKGs/Labs/Other Studies Reviewed:    The following studies were reviewed today:  EKG:  EKG ordered today.  The ekg ordered today demonstrates Jennette and  RBBB unchanged from previous QTc  REMOTE MONITORING ASSESSMENT Date  of Transmission: January 30, 2017 Patient Name: Charles, Gonzalez July 29, 1951, 66 y.o. Following Provider: Mahala Menghini, MD Primary Care Provider: Raina Mina, MD Manufacturer of Device: St. Jude [employer] Type of Device: Dual Chamber Pacemaker Presenting Rhythm: AS VS @ 60bpm Percentage RV Pacing: 2.9% RV Paced Percentage Biventricular Pacing: Not Applicable Device Findings:  No Significant Atrial or Ventricular Arrhythmias  Battery Status Adequate Battery Voltage  Lead Trends Lead Trends Stable      Recent Labs: 04/17/17 A1c 7.1%, No results found for requested labs within last 8760 hours.  Recent Lipid Panel No results found for: CHOL, TRIG, HDL, CHOLHDL, VLDL, LDLCALC, LDLDIRECT  Physical Exam:    VS:  BP 130/70 (BP Location: Right Arm, Patient Position: Sitting, Cuff Size: Normal)   Pulse 72   Ht 6\' 1"  (1.854 m)   Wt 240 lb (108.9 kg)   SpO2 96%   BMI 31.66 kg/m     Wt Readings from Last 3 Encounters:  06/18/17 240 lb (108.9 kg)     GEN:  Well nourished, well developed in no acute distress HEENT: Normal NECK: No JVD; No carotid  bruits LYMPHATICS: No lymphadenopathy CARDIAC: RRR, no murmurs, rubs, gallops RESPIRATORY:  Clear to auscultation without rales, wheezing or rhonchi  ABDOMEN: Soft, non-tender, non-distended MUSCULOSKELETAL:  No edema; No deformity  SKIN: Warm and dry NEUROLOGIC:  Alert and oriented x 3 PSYCHIATRIC:  Normal affect    Signed, Shirlee More, MD  06/18/2017 9:23 AM    [place]

## 2017-06-19 LAB — LIPID PANEL
Chol/HDL Ratio: 5.2 ratio — ABNORMAL HIGH (ref 0.0–5.0)
Cholesterol, Total: 194 mg/dL (ref 100–199)
HDL: 37 mg/dL — AB (ref >39)
LDL CALC: 109 mg/dL — AB (ref 0–99)
Triglycerides: 241 mg/dL — ABNORMAL HIGH (ref 0–149)
VLDL CHOLESTEROL CAL: 48 mg/dL — AB (ref 5–40)

## 2017-06-22 ENCOUNTER — Other Ambulatory Visit: Payer: Self-pay

## 2017-06-22 MED ORDER — NITROGLYCERIN 0.4 MG SL SUBL: 0.4000 mg | SUBLINGUAL_TABLET | SUBLINGUAL | 11 refills | Status: DC | PRN

## 2017-08-01 DIAGNOSIS — Z950 Presence of cardiac pacemaker: Secondary | ICD-10-CM | POA: Diagnosis not present

## 2017-08-29 DIAGNOSIS — J039 Acute tonsillitis, unspecified: Secondary | ICD-10-CM | POA: Diagnosis not present

## 2017-10-19 DIAGNOSIS — Z0000 Encounter for general adult medical examination without abnormal findings: Secondary | ICD-10-CM | POA: Diagnosis not present

## 2017-10-30 DIAGNOSIS — Z950 Presence of cardiac pacemaker: Secondary | ICD-10-CM | POA: Diagnosis not present

## 2017-11-27 DIAGNOSIS — I442 Atrioventricular block, complete: Secondary | ICD-10-CM | POA: Diagnosis not present

## 2017-11-27 DIAGNOSIS — Z45018 Encounter for adjustment and management of other part of cardiac pacemaker: Secondary | ICD-10-CM | POA: Diagnosis not present

## 2018-02-26 DIAGNOSIS — Z950 Presence of cardiac pacemaker: Secondary | ICD-10-CM | POA: Diagnosis not present

## 2018-04-22 DIAGNOSIS — D2239 Melanocytic nevi of other parts of face: Secondary | ICD-10-CM | POA: Diagnosis not present

## 2018-04-22 DIAGNOSIS — L400 Psoriasis vulgaris: Secondary | ICD-10-CM | POA: Diagnosis not present

## 2018-05-28 DIAGNOSIS — Z950 Presence of cardiac pacemaker: Secondary | ICD-10-CM | POA: Diagnosis not present

## 2018-07-01 ENCOUNTER — Other Ambulatory Visit: Payer: Self-pay | Admitting: Cardiology

## 2018-07-02 ENCOUNTER — Other Ambulatory Visit: Payer: Self-pay | Admitting: Cardiology

## 2018-07-02 ENCOUNTER — Telehealth: Payer: Self-pay | Admitting: Cardiology

## 2018-07-02 NOTE — Telephone Encounter (Signed)
°*  STAT* If patient is at the pharmacy, call can be transferred to refill team.   1. Which medications need to be refilled? (please list name of each medication and dose if known)Lisinopril 10 takes one daily  2. Which pharmacy/location (including street and city if local pharmacy) is medication to be sent to?[facility]  3. Do they need a 30 day or 90 day supply? 90   *STAT* If patient is at the pharmacy, call can be transferred to refill team.   1. Which medications need to be refilled? (please list name of each medication and dose if known) Sotalol 80mg  takes 2 daily  2. Which pharmacy/location (including street and city if local pharmacy) is medication to be sent to[facility]  3. Do they need a 30 day or 90 day supply? [place]

## 2018-07-02 NOTE — Telephone Encounter (Signed)
60 day refill has been sent, patient is overdue for f/u

## 2018-08-01 DIAGNOSIS — R05 Cough: Secondary | ICD-10-CM | POA: Diagnosis not present

## 2018-08-01 DIAGNOSIS — J069 Acute upper respiratory infection, unspecified: Secondary | ICD-10-CM | POA: Diagnosis not present

## 2018-08-08 DIAGNOSIS — J324 Chronic pansinusitis: Secondary | ICD-10-CM | POA: Diagnosis not present

## 2018-08-20 NOTE — Progress Notes (Signed)
Cardiology Office Note:    Date:  08/21/2018   ID:  Charles Gonzalez, DOB 18-Apr-1951, MRN [id number]  PCP:  Algis Greenhouse, MD  Cardiologist:  Shirlee More, MD    Referring MD: Algis Greenhouse, MD    ASSESSMENT:    1. Coronary artery disease of native artery of native heart with stable angina pectoris ([place])   2. PAD (peripheral artery disease) ([place])   3. PSVT (paroxysmal supraventricular tachycardia) ([place])   4. Presence of permanent cardiac pacemaker   5. Hyperlipidemia, unspecified hyperlipidemia type   6. High risk medication use   7. Essential hypertension    PLAN:    In order of problems listed above:  1. Stable CAD continue medical therapy including long-term aspirin and clopidogrel with peripheral arterial disease.  He declines lipid-lowering therapy I offered him PCSK9. 2. Stable asymptomatic having no claudication continue current medical treatment strongly encouraged regular walking activity program 3. Stable no recurrence continue his antiarrhythmic drug 4. Stable monitor through [place] device [facility] 5. Poorly controlled he declines lipid-lowering therapy I asked him to reconsider and contact me if he changes his mind 6. Sotalol stable no evidence of proarrhythmia toxicity 7. Stable blood pressure target continue treatment including ACE inhibitor again I have no access to labs performed with the monitor at the [facility] hospital for lipids liver and renal function   Next appointment: 6 months   Medication Adjustments/Labs and Tests Ordered: Current medicines are reviewed at length with the patient today.  Concerns regarding medicines are outlined above.  No orders of the defined types were placed in this encounter.  No orders of the defined types were placed in this encounter.   Chief Complaint  Patient presents with  . Follow-up    pacemaker  . Coronary Artery Disease  . Hypertension  . Hyperlipidemia    History of Present Illness:    Charles Gonzalez is a 68 y.o. male with a hx of CAD, SVT with bypass tract ablation 06/08/16 and on low dose sotolol , RBBB , Dyslipidemia with statin intolerance , HTN, Peripheral Vascular Disease with PCI of the LCIA in 2001 and a pacemaker last seen 06/1217. Compliance with diet, lifestyle and medications: Yes  He is pleased with the quality of his life he has had no palpitation syncope chest pain shortness of breath edema or claudication.  He continues to follow with EP [facility] and is due in the next week.  He takes sotalol and has not had any side effects.  We discussed lipid-lowering therapy has been intolerant of all statins and he declines PCSK9 at this time.  He is having primary care and labs follow through the [facility] hospital and I am unable to access them. Past Medical History:  Diagnosis Date  . Acute kidney failure with acute cortical necrosis ([place]) 04/25/2017  . Arthritis 04/25/2017  . Benign hypertension 10/06/2016  . C. difficile colitis 04/25/2017   02/2016  . CAD (coronary artery disease), native coronary artery 04/19/2015   Overview:  PCI of RCA 1995  . Cirrhosis ([place]) 04/25/2017  . Gastric ulcer 04/25/2017  . High risk medication use 03/01/2016   Overview:  sotolol  . Hyperlipidemia 04/19/2015  . Hypertension 04/25/2017  . Mixed hyperlipidemia 10/06/2016   Overview:  HIS indicated based on CHD, declines statin, felt bad, declines PCSK9i  . Obesity 04/25/2017  . Old MI (myocardial infarction) 04/03/1994  . Pacemaker 06/08/2015   Overview:  St Jude  . Pacemaker  reprogramming/check 06/28/2015  . PAD (peripheral artery disease) ([place]) 04/19/2015   Overview:  PCI of L CIA 2001  . Polyneuropathy in diabetes ([place]) 04/25/2017  . Presence of permanent cardiac pacemaker 06/14/2015  . PSVT (paroxysmal supraventricular tachycardia) ([place]) 04/19/2015  . S/P ablation of accessory bypass tract 06/09/2015  . S/P coronary artery stent placement 04/25/2017   Overview:  Multiple  . Shortness of breath  04/25/2017   Overview:  exertional and with tachycardia  . Transient complete heart block ([place]) 06/28/2015   Overview:  pacemaker  . Type 2 diabetes mellitus without complication, without long-term current use of insulin ([place]) 05/02/2015   Overview:  1998: dx    Past Surgical History:  Procedure Laterality Date  . ABLATION OF DYSRHYTHMIC FOCUS    . ATRIAL TACH ABLATION  06/14/2015   Procedure: Atrial Tachycardia Ablation accessory pathway was close to HIS bundle, we have to approach this differently either from noncoronary cusp or left atrium NAVIX CRYO; Surgeon: Mahala Menghini, MD; Location: [facility] EP; Service: Cardiology  . CHOLECYSTECTOMY    . CORONARY ANGIOPLASTY WITH STENT PLACEMENT    . ELECTROPHYSIOLOGIC STUDY  06/09/2015   Procedure: Comprehensive Study W IND POSS PPM BTK; Surgeon: Mahala Menghini, MD; Location: [facility] EP; Service: Cardiology   . SUPRAVENTRICULAR TACHYCARDIA ABLATION  05/24/2016   Procedure: Atrial Tachycardia Ablation esi; Surgeon: Mahala Menghini, MD; Location: [facility] EP; Service: Cardiology   . TONSILLECTOMY      Current Medications: Current Meds  Medication Sig  . aspirin EC 81 MG tablet Take 81 mg by mouth.  . clopidogrel (PLAVIX) 75 MG tablet Take 1 tablet (75 mg total) daily by mouth.  . colchicine 0.6 MG tablet Take 0.6 mg by mouth.  Marland Kitchen glipiZIDE (GLUCOTROL XL) 5 MG 24 hr tablet Take 5 mg by mouth.  Marland Kitchen lisinopril (PRINIVIL,ZESTRIL) 10 MG tablet TAKE ONE TABLET BY MOUTH EVERY DAY  . metFORMIN (GLUCOPHAGE) 500 MG tablet Take 500 mg by mouth.  . nitroGLYCERIN (NITROSTAT) 0.4 MG SL tablet Place 1 tablet (0.4 mg total) every 5 (five) minutes as needed under the tongue for chest pain.  . sotalol (BETAPACE) 80 MG tablet TAKE ONE TABLET BY MOUTH TWICE DAILY     Allergies:   Patient has no known allergies.   Social History   Socioeconomic History  . Marital status: Married    Spouse name: Not on file  . Number of children: Not on file  . Years of education: Not on file  .  Highest education level: Not on file  Occupational History  . Not on file  Social Needs  . Financial resource strain: Not on file  . Food insecurity:    Worry: Not on file    Inability: Not on file  . Transportation needs:    Medical: Not on file    Non-medical: Not on file  Tobacco Use  . Smoking status: Former Smoker    Years: 25.00    Last attempt to quit: 04/19/1994    Years since quitting: 24.3  . Smokeless tobacco: Never Used  Substance and Sexual Activity  . Alcohol use: No  . Drug use: No  . Sexual activity: Not on file  Lifestyle  . Physical activity:    Days per week: Not on file    Minutes per session: Not on file  . Stress: Not on file  Relationships  . Social connections:    Talks on phone: Not on file    Gets together: Not on file  Attends religious service: Not on file    Active member of club or organization: Not on file    Attends meetings of clubs or organizations: Not on file    Relationship status: Not on file  Other Topics Concern  . Not on file  Social History Narrative  . Not on file     Family History: The patient's family history includes CAD in his mother; Hypertension in his father. ROS:   Please see the history of present illness.    All other systems reviewed and are negative.  EKGs/Labs/Other Studies Reviewed:    The following studies were reviewed today:  EKG:  EKG ordered today.  The ekg ordered today demonstrates sinus rhythm right bundle branch block QT interval corrected is normal 453  Recent Labs: No results found for requested labs within last 8760 hours.  Recent Lipid Panel    Component Value Date/Time   CHOL 194 06/18/2017 1000   TRIG 241 (H) 06/18/2017 1000   HDL 37 (L) 06/18/2017 1000   CHOLHDL 5.2 (H) 06/18/2017 1000   LDLCALC 109 (H) 06/18/2017 1000    Physical Exam:    VS:  BP 134/62 (BP Location: Right Arm, Patient Position: Sitting, Cuff Size: Normal)   Pulse 72   Ht 6\' 1"  (1.854 m)   Wt 233 lb 2 oz  (105.7 kg)   SpO2 94%   BMI 30.76 kg/m     Wt Readings from Last 3 Encounters:  08/21/18 233 lb 2 oz (105.7 kg)  06/18/17 240 lb (108.9 kg)     GEN:  Well nourished, well developed in no acute distress HEENT: Normal NECK: No JVD; No carotid bruits LYMPHATICS: No lymphadenopathy CARDIAC: RRR, no murmurs, rubs, gallops RESPIRATORY:  Clear to auscultation without rales, wheezing or rhonchi  ABDOMEN: Soft, non-tender, non-distended MUSCULOSKELETAL:  No edema; No deformity  SKIN: Warm and dry NEUROLOGIC:  Alert and oriented x 3 PSYCHIATRIC:  Normal affect    Signed, Shirlee More, MD  08/21/2018 9:36 AM    [place]

## 2018-08-21 ENCOUNTER — Encounter: Payer: Self-pay | Admitting: Cardiology

## 2018-08-21 ENCOUNTER — Ambulatory Visit (INDEPENDENT_AMBULATORY_CARE_PROVIDER_SITE_OTHER): Payer: PPO | Admitting: Cardiology

## 2018-08-21 VITALS — BP 134/62 | HR 72 | Ht 73.0 in | Wt 233.1 lb

## 2018-08-21 DIAGNOSIS — Z950 Presence of cardiac pacemaker: Secondary | ICD-10-CM

## 2018-08-21 DIAGNOSIS — I739 Peripheral vascular disease, unspecified: Secondary | ICD-10-CM | POA: Diagnosis not present

## 2018-08-21 DIAGNOSIS — E785 Hyperlipidemia, unspecified: Secondary | ICD-10-CM | POA: Diagnosis not present

## 2018-08-21 DIAGNOSIS — I25118 Atherosclerotic heart disease of native coronary artery with other forms of angina pectoris: Secondary | ICD-10-CM | POA: Diagnosis not present

## 2018-08-21 DIAGNOSIS — I10 Essential (primary) hypertension: Secondary | ICD-10-CM | POA: Diagnosis not present

## 2018-08-21 DIAGNOSIS — Z79899 Other long term (current) drug therapy: Secondary | ICD-10-CM

## 2018-08-21 DIAGNOSIS — I471 Supraventricular tachycardia: Secondary | ICD-10-CM

## 2018-08-21 NOTE — Patient Instructions (Signed)

## 2018-08-27 DIAGNOSIS — Z950 Presence of cardiac pacemaker: Secondary | ICD-10-CM | POA: Diagnosis not present

## 2018-09-07 ENCOUNTER — Other Ambulatory Visit: Payer: Self-pay | Admitting: Cardiology

## 2018-09-09 ENCOUNTER — Other Ambulatory Visit: Payer: Self-pay

## 2018-09-09 MED ORDER — LISINOPRIL 10 MG PO TABS: 10.0000 mg | ORAL_TABLET | Freq: Every day | ORAL | 1 refills | Status: DC

## 2018-10-15 DIAGNOSIS — Z955 Presence of coronary angioplasty implant and graft: Secondary | ICD-10-CM | POA: Diagnosis not present

## 2018-10-15 DIAGNOSIS — Z950 Presence of cardiac pacemaker: Secondary | ICD-10-CM | POA: Diagnosis not present

## 2018-10-15 DIAGNOSIS — Z45018 Encounter for adjustment and management of other part of cardiac pacemaker: Secondary | ICD-10-CM | POA: Diagnosis not present

## 2018-10-15 DIAGNOSIS — I10 Essential (primary) hypertension: Secondary | ICD-10-CM | POA: Diagnosis not present

## 2018-10-15 DIAGNOSIS — E782 Mixed hyperlipidemia: Secondary | ICD-10-CM | POA: Diagnosis not present

## 2018-10-15 DIAGNOSIS — I471 Supraventricular tachycardia: Secondary | ICD-10-CM | POA: Diagnosis not present

## 2018-10-15 DIAGNOSIS — E119 Type 2 diabetes mellitus without complications: Secondary | ICD-10-CM | POA: Diagnosis not present

## 2018-10-15 DIAGNOSIS — I739 Peripheral vascular disease, unspecified: Secondary | ICD-10-CM | POA: Diagnosis not present

## 2018-10-15 DIAGNOSIS — I2510 Atherosclerotic heart disease of native coronary artery without angina pectoris: Secondary | ICD-10-CM | POA: Diagnosis not present

## 2018-10-15 DIAGNOSIS — I442 Atrioventricular block, complete: Secondary | ICD-10-CM | POA: Diagnosis not present

## 2018-10-15 DIAGNOSIS — Z9889 Other specified postprocedural states: Secondary | ICD-10-CM | POA: Diagnosis not present

## 2019-01-14 DIAGNOSIS — Z950 Presence of cardiac pacemaker: Secondary | ICD-10-CM | POA: Diagnosis not present

## 2019-01-30 DIAGNOSIS — R21 Rash and other nonspecific skin eruption: Secondary | ICD-10-CM | POA: Diagnosis not present

## 2019-01-30 DIAGNOSIS — L309 Dermatitis, unspecified: Secondary | ICD-10-CM | POA: Diagnosis not present

## 2019-03-05 ENCOUNTER — Other Ambulatory Visit: Payer: Self-pay | Admitting: Cardiology

## 2019-03-05 DIAGNOSIS — B351 Tinea unguium: Secondary | ICD-10-CM | POA: Diagnosis not present

## 2019-03-05 DIAGNOSIS — B353 Tinea pedis: Secondary | ICD-10-CM | POA: Diagnosis not present

## 2019-03-05 DIAGNOSIS — B352 Tinea manuum: Secondary | ICD-10-CM | POA: Diagnosis not present

## 2019-04-15 DIAGNOSIS — Z950 Presence of cardiac pacemaker: Secondary | ICD-10-CM | POA: Diagnosis not present

## 2019-05-12 DIAGNOSIS — E1169 Type 2 diabetes mellitus with other specified complication: Secondary | ICD-10-CM | POA: Diagnosis not present

## 2019-05-12 DIAGNOSIS — E119 Type 2 diabetes mellitus without complications: Secondary | ICD-10-CM | POA: Diagnosis not present

## 2019-05-14 ENCOUNTER — Encounter: Payer: Self-pay | Admitting: Cardiology

## 2019-05-14 ENCOUNTER — Other Ambulatory Visit: Payer: Self-pay

## 2019-05-14 ENCOUNTER — Encounter: Payer: Self-pay | Admitting: *Deleted

## 2019-05-14 ENCOUNTER — Ambulatory Visit (INDEPENDENT_AMBULATORY_CARE_PROVIDER_SITE_OTHER): Payer: PPO | Admitting: Cardiology

## 2019-05-14 VITALS — BP 122/62 | HR 69 | Ht 73.0 in | Wt 241.0 lb

## 2019-05-14 DIAGNOSIS — I471 Supraventricular tachycardia: Secondary | ICD-10-CM

## 2019-05-14 DIAGNOSIS — E785 Hyperlipidemia, unspecified: Secondary | ICD-10-CM | POA: Diagnosis not present

## 2019-05-14 DIAGNOSIS — I252 Old myocardial infarction: Secondary | ICD-10-CM | POA: Diagnosis not present

## 2019-05-14 DIAGNOSIS — Z950 Presence of cardiac pacemaker: Secondary | ICD-10-CM

## 2019-05-14 DIAGNOSIS — I25118 Atherosclerotic heart disease of native coronary artery with other forms of angina pectoris: Secondary | ICD-10-CM | POA: Diagnosis not present

## 2019-05-14 DIAGNOSIS — I10 Essential (primary) hypertension: Secondary | ICD-10-CM

## 2019-05-14 NOTE — Progress Notes (Signed)
Cardiology Office Note:    Date:  05/14/2019   ID:  Charles Gonzalez, DOB 1951/01/24, MRN [id number]  PCP:  Algis Greenhouse, MD  Cardiologist:  Shirlee More, MD    Referring MD: Algis Greenhouse, MD    ASSESSMENT:    1. Coronary artery disease of native artery of native heart with stable angina pectoris ([place])   2. Hyperlipidemia, unspecified hyperlipidemia type    PLAN:    In order of problems listed above:  1. Stable CAD New York Heart Association class I after PCI no current medical treatment he will continue treatment including long-term dual antiplatelet therapy and his beta-blocker. 2. Stable dyslipidemia await labs if LDL greater than 100 PCSK9 be appropriate 3. SVT stable no recurrence continue sotalol 4. Pacemaker stable managed by [facility]   Next appointment: 6 months   Medication Adjustments/Labs and Tests Ordered: Current medicines are reviewed at length with the patient today.  Concerns regarding medicines are outlined above.  Orders Placed This Encounter  Procedures   EKG 12-Lead   No orders of the defined types were placed in this encounter.   Chief Complaint  Patient presents with   Follow-up    for CDL   Coronary Artery Disease    History of Present Illness:    Charles Gonzalez is a 68 y.o. male with a hx of CAD, SVT with bypass tract ablation 06/08/16 and on low dose sotolol , RBBB , Dyslipidemia with statin intolerance , HTN, Peripheral Vascular Disease with PCI of the LCIA in 2001 and a pacemaker followed by [facility]  last seen 08/21/2018. 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  He is doing well with heart disease has no angina is active no exercise intolerance edema shortness of breath palpitation or syncope and has his device followed by EP at [facility].  His EKG today is stable with right bundle branch block and normal QT  interval on sotalol.  Recent labs performed last week requested from [facility] urgent care. Past Medical History:  Diagnosis Date   Acute kidney failure with acute cortical necrosis ([place]) 04/25/2017   Arthritis 04/25/2017   Benign hypertension 10/06/2016   C. difficile colitis 04/25/2017   02/2016   CAD (coronary artery disease), native coronary artery 04/19/2015   Overview:  PCI of RCA 1995   Cirrhosis ([place]) 04/25/2017   Gastric ulcer 04/25/2017   High risk medication use 03/01/2016   Overview:  sotolol   Hyperlipidemia 04/19/2015   Hypertension 04/25/2017   Mixed hyperlipidemia 10/06/2016   Overview:  HIS indicated based on CHD, declines statin, felt bad, declines PCSK9i   Obesity 04/25/2017   Old MI (myocardial infarction) 04/03/1994   Pacemaker 06/08/2015   Overview:  St Jude   Pacemaker reprogramming/check 06/28/2015   PAD (peripheral artery disease) ([place]) 04/19/2015   Overview:  PCI of L CIA 2001   Polyneuropathy in diabetes ([place]) 04/25/2017   Presence of permanent cardiac pacemaker 06/14/2015   PSVT (paroxysmal supraventricular tachycardia) ([place]) 04/19/2015   S/P ablation of accessory bypass tract 06/09/2015   S/P coronary artery stent placement 04/25/2017   Overview:  Multiple   Shortness of breath 04/25/2017   Overview:  exertional and with tachycardia   Transient complete heart block ([place]) 06/28/2015   Overview:  pacemaker   Type 2 diabetes mellitus without complication, without long-term current use of insulin ([place]) 05/02/2015  Overview:  1998: dx    Past Surgical History:  Procedure Laterality Date   ABLATION OF DYSRHYTHMIC FOCUS     ATRIAL TACH ABLATION  06/14/2015   Procedure: Atrial Tachycardia Ablation accessory pathway was close to HIS bundle, we have to approach this differently either from noncoronary cusp or left atrium NAVIX CRYO; Surgeon: Mahala Menghini, MD; Location: [facility] EP; Service: Cardiology   CHOLECYSTECTOMY     CORONARY ANGIOPLASTY WITH STENT  PLACEMENT     ELECTROPHYSIOLOGIC STUDY  06/09/2015   Procedure: Comprehensive Study W IND POSS PPM BTK; Surgeon: Mahala Menghini, MD; Location: [facility] EP; Service: Cardiology    SUPRAVENTRICULAR TACHYCARDIA ABLATION  05/24/2016   Procedure: Atrial Tachycardia Ablation esi; Surgeon: Mahala Menghini, MD; Location: [facility] EP; Service: Cardiology    TONSILLECTOMY      Current Medications: Current Meds  Medication Sig   aspirin EC 81 MG tablet Take 81 mg by mouth.   clopidogrel (PLAVIX) 75 MG tablet TAKE ONE TABLET BY MOUTH EVERY DAY   colchicine 0.6 MG tablet Take 0.6 mg by mouth.   glipiZIDE (GLUCOTROL XL) 5 MG 24 hr tablet Take 5 mg by mouth.   lisinopril (ZESTRIL) 10 MG tablet TAKE ONE TABLET BY MOUTH ONCE DAILY   metFORMIN (GLUCOPHAGE) 500 MG tablet Take 500 mg by mouth.   nitroGLYCERIN (NITROSTAT) 0.4 MG SL tablet Place 1 tablet (0.4 mg total) every 5 (five) minutes as needed under the tongue for chest pain.   sotalol (BETAPACE) 80 MG tablet TAKE ONE TABLET BY MOUTH TWICE DAILY     Allergies:   Patient has no known allergies.   Social History   Socioeconomic History   Marital status: Married    Spouse name: Not on file   Number of children: Not on file   Years of education: Not on file   Highest education level: Not on file  Occupational History   Not on file  Social Needs   Financial resource strain: Not on file   Food insecurity    Worry: Not on file    Inability: Not on file   Transportation needs    Medical: Not on file    Non-medical: Not on file  Tobacco Use   Smoking status: Former Smoker    Years: 25.00    Quit date: 04/19/1994    Years since quitting: 25.0   Smokeless tobacco: Never Used  Substance and Sexual Activity   Alcohol use: No   Drug use: No   Sexual activity: Not on file  Lifestyle   Physical activity    Days per week: Not on file    Minutes per session: Not on file   Stress: Not on file  Relationships   Social connections     Talks on phone: Not on file    Gets together: Not on file    Attends religious service: Not on file    Active member of club or organization: Not on file    Attends meetings of clubs or organizations: Not on file    Relationship status: Not on file  Other Topics Concern   Not on file  Social History Narrative   Not on file     Family History: The patient's family history includes CAD in his mother; Hypertension in his father. ROS:   Please see the history of present illness.    All other systems reviewed and are negative.  EKGs/Labs/Other Studies Reviewed:    The following studies were reviewed today:  EKG:  EKG  ordered today and personally reviewed.  The ekg ordered today demonstrates sinus rhythm normal QT interval right bundle branch block  Recent Labs: No results found for requested labs within last 8760 hours.  Recent Lipid Panel    Component Value Date/Time   CHOL 194 06/18/2017 1000   TRIG 241 (H) 06/18/2017 1000   HDL 37 (L) 06/18/2017 1000   CHOLHDL 5.2 (H) 06/18/2017 1000   LDLCALC 109 (H) 06/18/2017 1000    Physical Exam:    VS:  BP 122/62 (BP Location: Right Arm, Patient Position: Sitting, Cuff Size: Large)    Pulse 69    Ht 6\' 1"  (1.854 m)    Wt 241 lb (109.3 kg)    SpO2 95%    BMI 31.80 kg/m     Wt Readings from Last 3 Encounters:  05/14/19 241 lb (109.3 kg)  08/21/18 233 lb 2 oz (105.7 kg)  06/18/17 240 lb (108.9 kg)     GEN:  Well nourished, well developed in no acute distress HEENT: Normal NECK: No JVD; No carotid bruits LYMPHATICS: No lymphadenopathy CARDIAC: RRR, no murmurs, rubs, gallops RESPIRATORY:  Clear to auscultation without rales, wheezing or rhonchi  ABDOMEN: Soft, non-tender, non-distended MUSCULOSKELETAL:  No edema; No deformity  SKIN: Warm and dry NEUROLOGIC:  Alert and oriented x 3 PSYCHIATRIC:  Normal affect    Signed, Shirlee More, MD  05/14/2019 4:03 PM    [facility]

## 2019-05-14 NOTE — Patient Instructions (Signed)
Medication Instructions:  Your physician recommends that you continue on your current medications as directed. Please refer to the Current Medication list given to you today.  If you need a refill on your cardiac medications before your next appointment, please call your pharmacy.   Lab work: None  **Your recent lab work from [facility] has been requested for Dr. Bettina Gavia to review.   If you have labs (blood work) drawn today and your tests are completely normal, you will receive your results only by: Marland Kitchen MyChart Message (if you have MyChart) OR . A paper copy in the mail If you have any lab test that is abnormal or we need to change your treatment, we will call you to review the results.  Testing/Procedures: You had an EKG today.   Follow-Up: At [facility], you and your health needs are our priority.  As part of our continuing mission to provide you with exceptional heart care, we have created designated Provider Care Teams.  These Care Teams include your primary Cardiologist (physician) and Advanced Practice Providers (APPs -  Physician Assistants and Nurse Practitioners) who all work together to provide you with the care you need, when you need it. You will need a follow up appointment in 6 months.  Please call our office 2 months in advance to schedule this appointment.

## 2019-05-15 NOTE — Addendum Note (Signed)
Addended by: Austin Miles on: 05/15/2019 04:42 PM   Modules accepted: Orders

## 2019-05-19 ENCOUNTER — Other Ambulatory Visit (HOSPITAL_COMMUNITY): Payer: PPO

## 2019-07-01 ENCOUNTER — Other Ambulatory Visit: Payer: Self-pay | Admitting: Cardiology

## 2019-07-02 NOTE — Telephone Encounter (Signed)
Ntg refill sent to [facility] [address] in [place] per request. Phoned and informed pts wife rx has been sent in as requested.

## 2019-07-02 NOTE — Telephone Encounter (Signed)
Please send nitro script in [facility] [address] in [place]. He lost his bottle so it out.

## 2019-07-10 ENCOUNTER — Telehealth: Payer: Self-pay | Admitting: Cardiology

## 2019-07-10 NOTE — Telephone Encounter (Signed)
Patient is wanting to know if he can take tylenol as needed for pain after his recent fall. Spoke with Dr. Bettina Gavia who advised patient should take no more than 6 regular tylenol pills daily. Patient also asked about high dose tylenol arthritis. Dr. Bettina Gavia advised to take no more than 2 pills twice daily of this medication. Patient is agreeable and verbalized understanding. No further questions.

## 2019-07-10 NOTE — Telephone Encounter (Signed)
Patient called and fell and wants to take something for pain for his ribs.Marland Kitchen

## 2019-07-15 DIAGNOSIS — Z950 Presence of cardiac pacemaker: Secondary | ICD-10-CM | POA: Diagnosis not present

## 2019-08-30 ENCOUNTER — Other Ambulatory Visit: Payer: Self-pay | Admitting: Cardiology

## 2019-10-01 DIAGNOSIS — I471 Supraventricular tachycardia: Secondary | ICD-10-CM | POA: Diagnosis not present

## 2019-10-01 DIAGNOSIS — Z950 Presence of cardiac pacemaker: Secondary | ICD-10-CM | POA: Diagnosis not present

## 2019-10-14 DIAGNOSIS — Z950 Presence of cardiac pacemaker: Secondary | ICD-10-CM | POA: Diagnosis not present

## 2019-10-21 DIAGNOSIS — Z9889 Other specified postprocedural states: Secondary | ICD-10-CM | POA: Diagnosis not present

## 2019-10-21 DIAGNOSIS — Z955 Presence of coronary angioplasty implant and graft: Secondary | ICD-10-CM | POA: Diagnosis not present

## 2019-10-21 DIAGNOSIS — I10 Essential (primary) hypertension: Secondary | ICD-10-CM | POA: Diagnosis not present

## 2019-10-21 DIAGNOSIS — E119 Type 2 diabetes mellitus without complications: Secondary | ICD-10-CM | POA: Diagnosis not present

## 2019-10-21 DIAGNOSIS — E782 Mixed hyperlipidemia: Secondary | ICD-10-CM | POA: Diagnosis not present

## 2019-10-21 DIAGNOSIS — I2510 Atherosclerotic heart disease of native coronary artery without angina pectoris: Secondary | ICD-10-CM | POA: Diagnosis not present

## 2019-10-21 DIAGNOSIS — I739 Peripheral vascular disease, unspecified: Secondary | ICD-10-CM | POA: Diagnosis not present

## 2019-10-21 DIAGNOSIS — I442 Atrioventricular block, complete: Secondary | ICD-10-CM | POA: Diagnosis not present

## 2019-10-21 DIAGNOSIS — Z950 Presence of cardiac pacemaker: Secondary | ICD-10-CM | POA: Diagnosis not present

## 2019-10-21 DIAGNOSIS — I471 Supraventricular tachycardia: Secondary | ICD-10-CM | POA: Diagnosis not present

## 2019-11-15 ENCOUNTER — Other Ambulatory Visit: Payer: Self-pay | Admitting: Cardiology

## 2020-01-13 DIAGNOSIS — Z950 Presence of cardiac pacemaker: Secondary | ICD-10-CM | POA: Diagnosis not present

## 2020-02-19 ENCOUNTER — Other Ambulatory Visit: Payer: Self-pay | Admitting: Cardiology

## 2020-04-26 DIAGNOSIS — Z45018 Encounter for adjustment and management of other part of cardiac pacemaker: Secondary | ICD-10-CM | POA: Diagnosis not present

## 2020-04-26 DIAGNOSIS — I442 Atrioventricular block, complete: Secondary | ICD-10-CM | POA: Diagnosis not present

## 2020-05-28 ENCOUNTER — Other Ambulatory Visit: Payer: Self-pay | Admitting: Cardiology

## 2020-07-26 DIAGNOSIS — Z950 Presence of cardiac pacemaker: Secondary | ICD-10-CM | POA: Diagnosis not present

## 2020-09-02 ENCOUNTER — Encounter: Payer: Self-pay | Admitting: Cardiology

## 2020-09-02 ENCOUNTER — Other Ambulatory Visit: Payer: Self-pay

## 2020-09-02 ENCOUNTER — Ambulatory Visit: Payer: PPO | Admitting: Cardiology

## 2020-09-02 VITALS — BP 124/64 | HR 64 | Ht 73.0 in | Wt 249.8 lb

## 2020-09-02 DIAGNOSIS — Z950 Presence of cardiac pacemaker: Secondary | ICD-10-CM | POA: Diagnosis not present

## 2020-09-02 DIAGNOSIS — I25118 Atherosclerotic heart disease of native coronary artery with other forms of angina pectoris: Secondary | ICD-10-CM | POA: Diagnosis not present

## 2020-09-02 DIAGNOSIS — I471 Supraventricular tachycardia: Secondary | ICD-10-CM

## 2020-09-02 DIAGNOSIS — I739 Peripheral vascular disease, unspecified: Secondary | ICD-10-CM

## 2020-09-02 DIAGNOSIS — E785 Hyperlipidemia, unspecified: Secondary | ICD-10-CM

## 2020-09-02 DIAGNOSIS — I10 Essential (primary) hypertension: Secondary | ICD-10-CM

## 2020-09-02 NOTE — Patient Instructions (Signed)

## 2020-09-02 NOTE — Progress Notes (Signed)
Cardiology Office Note:    Date:  09/02/2020   ID:  Charles Gonzalez, DOB Aug 22, 1950, MRN [id number]  PCP:  Algis Greenhouse, MD  Cardiologist:  Shirlee More, MD    Referring MD: Algis Greenhouse, MD    ASSESSMENT:    1. Coronary artery disease of native artery of native heart with stable angina pectoris ([place])   2. PSVT (paroxysmal supraventricular tachycardia) ([place])   3. Presence of permanent cardiac pacemaker   4. Hyperlipidemia, unspecified hyperlipidemia type   5. Essential hypertension   6. PAD (peripheral artery disease) ([facility])    PLAN:    In order of problems listed above:  1. Overall he has done well has no angina dual antiplatelet therapy with PAD and again declines lipid-lowering treatment.  I offered him a PCSK9 inhibitor. 2. SVT stable no recurrence permanent pacemaker not dependent followed by [facility] and continue low-dose sotalol 3. Poorly controlled hyperlipidemia declines additional drug therapy including PCSK9 inhibitor 4. Stable at target normal renal function continue lisinopril 5. Stable diabetes on Metformin   Next appointment: 6 Months   Medication Adjustments/Labs and Tests Ordered: Current medicines are reviewed at length with the patient today.  Concerns regarding medicines are outlined above.  Orders Placed This Encounter  Procedures  . EKG 12-Lead   No orders of the defined types were placed in this encounter.   Chief Complaint  Patient presents with  . Follow-up  . Coronary Artery Disease    History of Present Illness:    Charles Gonzalez is a 70 y.o. male with a hx of CAD SVT with bypass tract ablation 2017 and maintained on low-dose sotalol right bundle branch block dyslipidemia statin intolerance hypertension peripheral vascular disease with PCI of the left common iliac artery in 2001 and permanent pacemaker followed by [facility].  He was last seen 02/2019.  He continues to have his device followed and is compliant with  pacemaker evaluation.  He had recent labs at the [facility] hospital cholesterol 223 LDL 105 triglycerides 376 HDL 40 CMP was normal potassium 4.2 creatinine 1.10 normal liver function.  I mailed him a prescription for statin he has been intolerant in the past he refuses lipid-lowering therapy.  I offered him PCSK9 inhibitor and he again declines.  He tolerates sotalol has had no recurrent tachyarrhythmia.  He has been in the habit of walking every day and his dog died he has become sedentary and now he notices exercise intolerance.  He is not having claudication or chest pain.  We discussed an ischemia evaluation he declined he said he is going to get in a regular walking activity program and if the symptoms worsen become more typical he contact me and will need either cardiac perfusion study or referral to coronary angiography. Compliance with diet, lifestyle and medications: Yes Past Medical History:  Diagnosis Date  . Acute kidney failure with acute cortical necrosis ([place]) 04/25/2017  . Arthritis 04/25/2017  . Benign hypertension 10/06/2016  . C. difficile colitis 04/25/2017   02/2016  . CAD (coronary artery disease), native coronary artery 04/19/2015   Overview:  PCI of RCA 1995  . Cirrhosis ([place]) 04/25/2017  . Essential hypertension 04/25/2017  . Gastric ulcer 04/25/2017  . High risk medication use 03/01/2016   Overview:  sotolol  . Hyperlipidemia 04/19/2015  . Hypertension 04/25/2017  . Mixed hyperlipidemia 10/06/2016   Overview:  HIS indicated based on CHD, declines statin, felt bad, declines PCSK9i  . Obesity 04/25/2017  . Old  MI (myocardial infarction) 04/03/1994  . Pacemaker 06/08/2015   Overview:  St Jude  . Pacemaker reprogramming/check 06/28/2015  . PAD (peripheral artery disease) ([place]) 04/19/2015   Overview:  PCI of L CIA 2001  . Polyneuropathy in diabetes ([place]) 04/25/2017  . Presence of permanent cardiac pacemaker 06/14/2015  . PSVT (paroxysmal supraventricular tachycardia) ([place]) 04/19/2015  . S/P  ablation of accessory bypass tract 06/09/2015  . S/P coronary artery stent placement 04/25/2017   Overview:  Multiple  . Shortness of breath 04/25/2017   Overview:  exertional and with tachycardia  . Transient complete heart block ([place]) 06/28/2015   Overview:  pacemaker  . Type 2 diabetes mellitus without complication, without long-term current use of insulin ([place]) 05/02/2015   Overview:  1998: dx    Past Surgical History:  Procedure Laterality Date  . ABLATION OF DYSRHYTHMIC FOCUS    . ATRIAL TACH ABLATION  06/14/2015   Procedure: Atrial Tachycardia Ablation accessory pathway was close to HIS bundle, we have to approach this differently either from noncoronary cusp or left atrium NAVIX CRYO; Surgeon: Mahala Menghini, MD; Location: [facility] EP; Service: Cardiology  . CHOLECYSTECTOMY    . CORONARY ANGIOPLASTY WITH STENT PLACEMENT    . ELECTROPHYSIOLOGIC STUDY  06/09/2015   Procedure: Comprehensive Study W IND POSS PPM BTK; Surgeon: Mahala Menghini, MD; Location: [facility] EP; Service: Cardiology   . SUPRAVENTRICULAR TACHYCARDIA ABLATION  05/24/2016   Procedure: Atrial Tachycardia Ablation esi; Surgeon: Mahala Menghini, MD; Location: [facility] EP; Service: Cardiology   . TONSILLECTOMY      Current Medications: Current Meds  Medication Sig  . aspirin EC 81 MG tablet Take 81 mg by mouth 1 day or 1 dose.  . clopidogrel (PLAVIX) 75 MG tablet TAKE ONE TABLET BY MOUTH ONCE DAILY  . colchicine 0.6 MG tablet Take 0.6 mg by mouth.  Marland Kitchen glipiZIDE (GLUCOTROL XL) 5 MG 24 hr tablet Take 5 mg by mouth.  Marland Kitchen lisinopril (ZESTRIL) 10 MG tablet TAKE 1 TABLET BY MOUTH ONCE DAILY.  . metFORMIN (GLUCOPHAGE) 500 MG tablet Take 500 mg by mouth.  . nitroGLYCERIN (NITROSTAT) 0.4 MG SL tablet PLACE 1 TABLET UNDER THE TONGUE EVERY 5 MINUTES AS NEEDED FOR CHEST PAIN.  . sotalol (BETAPACE) 80 MG tablet TAKE ONE TABLET BY MOUTH TWICE DAILY     Allergies:   Patient has no known allergies.   Social History   Socioeconomic History  . Marital  status: Married    Spouse name: Not on file  . Number of children: Not on file  . Years of education: Not on file  . Highest education level: Not on file  Occupational History  . Not on file  Tobacco Use  . Smoking status: Former Smoker    Years: 25.00    Quit date: 04/19/1994    Years since quitting: 26.3  . Smokeless tobacco: Never Used  Vaping Use  . Vaping Use: Never used  Substance and Sexual Activity  . Alcohol use: No  . Drug use: No  . Sexual activity: Not on file  Other Topics Concern  . Not on file  Social History Narrative  . Not on file   Social Determinants of Health   Financial Resource Strain: Not on file  Food Insecurity: Not on file  Transportation Needs: Not on file  Physical Activity: Not on file  Stress: Not on file  Social Connections: Not on file     Family History: The patient's family history includes CAD in his mother; Hypertension in his  father. ROS:   Please see the history of present illness.    All other systems reviewed and are negative.  EKGs/Labs/Other Studies Reviewed:    The following studies were reviewed today:  EKG:  EKG ordered today and personally reviewed.  The ekg ordered today demonstrates sinus rhythm right bundle branch block  Recent Labs: No results found for requested labs within last 8760 hours.  Recent Lipid Panel    Component Value Date/Time   CHOL 194 06/18/2017 1000   TRIG 241 (H) 06/18/2017 1000   HDL 37 (L) 06/18/2017 1000   CHOLHDL 5.2 (H) 06/18/2017 1000   LDLCALC 109 (H) 06/18/2017 1000    Physical Exam:    VS:  BP 124/64   Pulse 64   Ht 6\' 1"  (1.854 m)   Wt 249 lb 12.8 oz (113.3 kg)   SpO2 98%   BMI 32.96 kg/m     Wt Readings from Last 3 Encounters:  09/02/20 249 lb 12.8 oz (113.3 kg)  05/14/19 241 lb (109.3 kg)  08/21/18 233 lb 2 oz (105.7 kg)     GEN:  Well nourished, well developed in no acute distress HEENT: Normal NECK: No JVD; No carotid bruits LYMPHATICS: No  lymphadenopathy CARDIAC: RRR, no murmurs, rubs, gallops RESPIRATORY:  Clear to auscultation without rales, wheezing or rhonchi  ABDOMEN: Soft, non-tender, non-distended MUSCULOSKELETAL:  No edema; No deformity  SKIN: Warm and dry NEUROLOGIC:  Alert and oriented x 3 PSYCHIATRIC:  Normal affect    Signed, Shirlee More, MD  09/02/2020 2:08 PM    [facility]

## 2020-09-07 ENCOUNTER — Telehealth: Payer: Self-pay | Admitting: Cardiology

## 2020-09-07 DIAGNOSIS — I252 Old myocardial infarction: Secondary | ICD-10-CM

## 2020-09-07 DIAGNOSIS — R079 Chest pain, unspecified: Secondary | ICD-10-CM

## 2020-09-07 NOTE — Telephone Encounter (Signed)
Charles Gonzalez is calling stating he has decided he would like to schedule the stress test Dr. Bettina Gavia discussed him having at his last appointment. He is requesting a callback to schedule once the order is placed. Please advise.

## 2020-09-08 ENCOUNTER — Telehealth: Payer: Self-pay

## 2020-09-08 NOTE — Telephone Encounter (Signed)
Set up [facility] office

## 2020-09-08 NOTE — Telephone Encounter (Signed)
Spoke with the patient, detailed instructions given. He stated that he would be here for his test. Asked to call back with any questions. S.Maxden Naji EMTP 

## 2020-09-08 NOTE — Telephone Encounter (Signed)
Spoke to the patient just now and let him know that I would put this order in for him. We went over the below instructions and he verbalizes understanding.   I will route this to Johnna to schedule the stress test in [place].    [facility] [address] [place], [place] [address] Phone:  [phone]    Please arrive 15 minutes prior to your appointment time for registration and insurance purposes.  The test will take approximately 3 to 4 hours to complete; you may bring reading material.  If someone comes with you to your appointment, they will need to remain in the main lobby due to limited space in the testing area. **If you are pregnant or breastfeeding, please notify the nuclear lab prior to your appointment**  How to prepare for your Myocardial Perfusion Test: . Do not eat or drink 3 hours prior to your test, except you may have water. . Do not consume products containing caffeine (regular or decaffeinated) 12 hours prior to your test. (ex: coffee, chocolate, sodas, tea). . Do bring a list of your current medications with you.  If not listed below, you may take your medications as normal.  . Hold your metformin and glipizide the morning of the test.  . Do wear comfortable clothes (no dresses or overalls) and walking shoes, tennis shoes preferred (No heels or open toe shoes are allowed). . Do NOT wear cologne, perfume, aftershave, or lotions (deodorant is allowed). . If these instructions are not followed, your test will have to be rescheduled.  Please report to [address] for your test.  If you have questions or concerns about your appointment, you can call the [facility] at [phone].  If you cannot keep your appointment, please provide 24 hours notification to the Nuclear Lab, to avoid a possible $50 charge to your account.

## 2020-09-08 NOTE — Addendum Note (Signed)
Addended by: Resa Miner I on: 09/08/2020 09:14 AM   Modules accepted: Orders

## 2020-09-09 ENCOUNTER — Telehealth: Payer: Self-pay

## 2020-09-09 NOTE — Addendum Note (Signed)
Addended byShirlee More on: 09/09/2020 10:17 AM   Modules accepted: Orders

## 2020-09-09 NOTE — Telephone Encounter (Signed)
Attestation order placed for lexiscan

## 2020-09-14 ENCOUNTER — Ambulatory Visit (INDEPENDENT_AMBULATORY_CARE_PROVIDER_SITE_OTHER): Payer: PPO

## 2020-09-14 ENCOUNTER — Telehealth: Payer: Self-pay

## 2020-09-14 ENCOUNTER — Other Ambulatory Visit: Payer: Self-pay

## 2020-09-14 DIAGNOSIS — R079 Chest pain, unspecified: Secondary | ICD-10-CM

## 2020-09-14 DIAGNOSIS — I252 Old myocardial infarction: Secondary | ICD-10-CM | POA: Diagnosis not present

## 2020-09-14 LAB — MYOCARDIAL PERFUSION IMAGING
LV dias vol: 99 mL (ref 62–150)
LV sys vol: 39 mL
Peak HR: 85 {beats}/min
Rest HR: 71 {beats}/min
SDS: 2
SRS: 7
SSS: 9
TID: 1.06

## 2020-09-14 MED ORDER — TECHNETIUM TC 99M TETROFOSMIN IV KIT
10.8000 | PACK | Freq: Once | INTRAVENOUS | Status: AC | PRN
  Administered 2020-09-14: 10.8 via INTRAVENOUS

## 2020-09-14 MED ORDER — REGADENOSON 0.4 MG/5ML IV SOLN
0.4000 mg | Freq: Once | INTRAVENOUS | Status: AC
  Administered 2020-09-14: 0.4 mg via INTRAVENOUS

## 2020-09-14 MED ORDER — TECHNETIUM TC 99M TETROFOSMIN IV KIT
30.4000 | PACK | Freq: Once | INTRAVENOUS | Status: AC | PRN
  Administered 2020-09-14: 30.4 via INTRAVENOUS

## 2020-09-14 NOTE — Telephone Encounter (Signed)
Left message on patients voicemail to please return our call.   

## 2020-09-14 NOTE — Telephone Encounter (Signed)
Spoke with patient regarding results and recommendation.  Patient verbalizes understanding and is agreeable to plan of care. Advised patient to call back with any issues or concerns.  

## 2020-09-14 NOTE — Telephone Encounter (Signed)
Patient is returning call.  °

## 2020-09-14 NOTE — Telephone Encounter (Signed)
-----   Message from Richardo Priest, MD sent at 09/14/2020  3:18 PM EST ----- We did this because Mr. Charles Gonzalez was a little concerned about his outlook in life with heart disease There is a good result normal cardiac function and no ischemia low risk test. I find this reassuring.

## 2020-10-25 DIAGNOSIS — Z950 Presence of cardiac pacemaker: Secondary | ICD-10-CM | POA: Diagnosis not present

## 2020-10-26 DIAGNOSIS — Z9889 Other specified postprocedural states: Secondary | ICD-10-CM | POA: Diagnosis not present

## 2020-10-26 DIAGNOSIS — I471 Supraventricular tachycardia: Secondary | ICD-10-CM | POA: Diagnosis not present

## 2020-10-26 DIAGNOSIS — Z45018 Encounter for adjustment and management of other part of cardiac pacemaker: Secondary | ICD-10-CM | POA: Diagnosis not present

## 2020-10-26 DIAGNOSIS — I2510 Atherosclerotic heart disease of native coronary artery without angina pectoris: Secondary | ICD-10-CM | POA: Diagnosis not present

## 2020-10-26 DIAGNOSIS — Z955 Presence of coronary angioplasty implant and graft: Secondary | ICD-10-CM | POA: Diagnosis not present

## 2020-10-26 DIAGNOSIS — I442 Atrioventricular block, complete: Secondary | ICD-10-CM | POA: Diagnosis not present

## 2020-10-26 DIAGNOSIS — Z79899 Other long term (current) drug therapy: Secondary | ICD-10-CM | POA: Diagnosis not present

## 2020-10-27 DIAGNOSIS — I4510 Unspecified right bundle-branch block: Secondary | ICD-10-CM | POA: Diagnosis not present

## 2020-11-01 DIAGNOSIS — S91341A Puncture wound with foreign body, right foot, initial encounter: Secondary | ICD-10-CM | POA: Diagnosis not present

## 2021-01-24 DIAGNOSIS — Z950 Presence of cardiac pacemaker: Secondary | ICD-10-CM | POA: Diagnosis not present

## 2021-01-26 ENCOUNTER — Other Ambulatory Visit: Payer: Self-pay | Admitting: Cardiology

## 2021-03-18 ENCOUNTER — Other Ambulatory Visit: Payer: Self-pay | Admitting: Cardiology

## 2021-04-25 ENCOUNTER — Other Ambulatory Visit: Payer: Self-pay | Admitting: Cardiology

## 2021-04-25 DIAGNOSIS — Z45018 Encounter for adjustment and management of other part of cardiac pacemaker: Secondary | ICD-10-CM | POA: Diagnosis not present

## 2021-06-23 ENCOUNTER — Other Ambulatory Visit: Payer: Self-pay

## 2021-06-23 ENCOUNTER — Ambulatory Visit: Payer: PPO | Admitting: Cardiology

## 2021-06-23 ENCOUNTER — Encounter: Payer: Self-pay | Admitting: Cardiology

## 2021-06-23 VITALS — BP 136/80 | HR 63 | Ht 73.0 in | Wt 242.4 lb

## 2021-06-23 DIAGNOSIS — E785 Hyperlipidemia, unspecified: Secondary | ICD-10-CM

## 2021-06-23 DIAGNOSIS — I739 Peripheral vascular disease, unspecified: Secondary | ICD-10-CM

## 2021-06-23 DIAGNOSIS — I471 Supraventricular tachycardia: Secondary | ICD-10-CM | POA: Diagnosis not present

## 2021-06-23 DIAGNOSIS — I10 Essential (primary) hypertension: Secondary | ICD-10-CM

## 2021-06-23 DIAGNOSIS — Z950 Presence of cardiac pacemaker: Secondary | ICD-10-CM

## 2021-06-23 DIAGNOSIS — I25118 Atherosclerotic heart disease of native coronary artery with other forms of angina pectoris: Secondary | ICD-10-CM

## 2021-06-23 NOTE — Progress Notes (Signed)
Cardiology Office Note:    Date:  06/23/2021   ID:  Charles Gonzalez, DOB 1950-08-13, MRN [id number]  PCP:  Algis Greenhouse, MD  Cardiologist:  Shirlee More, MD    Referring MD: Algis Greenhouse, MD    ASSESSMENT:    1. Coronary artery disease of native artery of native heart with stable angina pectoris ([place])   2. Essential hypertension   3. PSVT (paroxysmal supraventricular tachycardia) ([place])   4. Presence of permanent cardiac pacemaker   5. Hyperlipidemia, unspecified hyperlipidemia type   6. PAD (peripheral artery disease) (HCC)    PLAN:    In order of problems listed above:  Muadh continues to do well with his CAD and PAD having no angina or claudication after percutaneous revascularization of both processes he will continue his chronic dual antiplatelet therapy he is statin intolerant and has declined PCSK9 treatment.  At this time I would not do an ischemia evaluation Stable BP at target continue ACE inhibitor SVT stable no recurrence we will continue sotalol he is taking a subtherapeutic dose and mostly getting a beta-blocker effect Stable pacemaker function followed in device clinic [facility] supervise Dr. Adrian Prows Poorly controlled statin intolerant Stable having no claudication   Next appointment: 9 months   Medication Adjustments/Labs and Tests Ordered: Current medicines are reviewed at length with the patient today.  Concerns regarding medicines are outlined above.  Orders Placed This Encounter  Procedures   EKG 12-Lead   No orders of the defined types were placed in this encounter.   Chief Complaint  Patient presents with   Follow-up   Coronary Artery Disease   Tachycardia    \ History of Present Illness:    Charles Gonzalez is a 70 y.o. male with a hx of CAD with PCI of the right coronary artery in 1990 following SVT with bypass tract ablation 2017 and maintained on low-dose sotalol right bundle branch block dyslipidemia statin intolerance  hypertensive heart disease peripheral vascular disease with PCI of the left common iliac artery in 2001 and permanent pacemaker for bradycardia followed by [facility].  He was last seen 09/02/2020.  Compliance with diet, lifestyle and medications: Yes He is receiving most of his care through the [facility] hospital is in the process of disability with his ischemic heart disease/CAD and peripheral arterial disease and tells me has been seen by hematology for monoclonal gammopathy. Cardiology perspective doing well no rapid heart rhythm chest pain edema shortness of breath palpitation or syncope. He tolerates sotalol without side effect and has had no concerning arrhythmia on device telemetry follow through his electrophysiologist His pacemaker follow-up is through electrophysiology [facility] in [place] Past Medical History:  Diagnosis Date   Acute kidney failure with acute cortical necrosis ([place]) 04/25/2017   Arthritis 04/25/2017   Benign hypertension 10/06/2016   C. difficile colitis 04/25/2017   02/2016   CAD (coronary artery disease), native coronary artery 04/19/2015   Overview:  PCI of RCA 1995   Cirrhosis ([place]) 04/25/2017   Essential hypertension 04/25/2017   Gastric ulcer 04/25/2017   High risk medication use 03/01/2016   Overview:  sotolol   Hyperlipidemia 04/19/2015   Hypertension 04/25/2017   Mixed hyperlipidemia 10/06/2016   Overview:  HIS indicated based on CHD, declines statin, felt bad, declines PCSK9i   Obesity 04/25/2017   Old MI (myocardial infarction) 04/03/1994   Pacemaker 06/08/2015   Overview:  St Jude   Pacemaker reprogramming/check 06/28/2015   PAD (peripheral  artery disease) ([place]) 04/19/2015   Overview:  PCI of L CIA 2001   Polyneuropathy in diabetes ([place]) 04/25/2017   Presence of permanent cardiac pacemaker 06/14/2015   PSVT (paroxysmal supraventricular tachycardia) ([place]) 04/19/2015   S/P ablation of accessory bypass tract 06/09/2015   S/P coronary artery stent  placement 04/25/2017   Overview:  Multiple   Shortness of breath 04/25/2017   Overview:  exertional and with tachycardia   Transient complete heart block ([place]) 06/28/2015   Overview:  pacemaker   Type 2 diabetes mellitus without complication, without long-term current use of insulin ([place]) 05/02/2015   Overview:  1998: dx    Past Surgical History:  Procedure Laterality Date   ABLATION OF DYSRHYTHMIC FOCUS     ATRIAL TACH ABLATION  06/14/2015   Procedure: Atrial Tachycardia Ablation accessory pathway was close to HIS bundle, we have to approach this differently either from noncoronary cusp or left atrium NAVIX CRYO; Surgeon: Mahala Menghini, MD; Location: [facility] EP; Service: Cardiology   CHOLECYSTECTOMY     CORONARY ANGIOPLASTY WITH STENT PLACEMENT     ELECTROPHYSIOLOGIC STUDY  06/09/2015   Procedure: Comprehensive Study W IND POSS PPM BTK; Surgeon: Mahala Menghini, MD; Location: [facility] EP; Service: Cardiology    SUPRAVENTRICULAR TACHYCARDIA ABLATION  05/24/2016   Procedure: Atrial Tachycardia Ablation esi; Surgeon: Mahala Menghini, MD; Location: [facility] EP; Service: Cardiology    TONSILLECTOMY      Current Medications: Current Meds  Medication Sig   aspirin EC 81 MG tablet Take 81 mg by mouth 1 day or 1 dose.   clopidogrel (PLAVIX) 75 MG tablet TAKE 1 TABLET BY MOUTH ONCE DAILY.   colchicine 0.6 MG tablet Take 0.6 mg by mouth.   glipiZIDE (GLUCOTROL XL) 5 MG 24 hr tablet Take 5 mg by mouth.   lisinopril (ZESTRIL) 10 MG tablet TAKE ONE TABLET BY MOUTH EVERY DAY   metFORMIN (GLUCOPHAGE) 500 MG tablet Take 500 mg by mouth.   nitroGLYCERIN (NITROSTAT) 0.4 MG SL tablet TAKE 1 TABLET UNDER TONGUE AS NEEDED FORCHEST PAIN REPEAT IN 5 MINUTES IF NEEDED   sotalol (BETAPACE) 80 MG tablet TAKE ONE TABLET BY MOUTH TWICE DAILY     Allergies:   Atorvastatin and Glipizide   Social History   Socioeconomic History   Marital status: Married    Spouse name: Not on file   Number of children: Not on file   Years of  education: Not on file   Highest education level: Not on file  Occupational History   Not on file  Tobacco Use   Smoking status: Former    Years: 25.00    Types: Cigarettes    Quit date: 04/19/1994    Years since quitting: 27.1   Smokeless tobacco: Never  Vaping Use   Vaping Use: Never used  Substance and Sexual Activity   Alcohol use: No   Drug use: No   Sexual activity: Not on file  Other Topics Concern   Not on file  Social History Narrative   Not on file   Social Determinants of Health   Financial Resource Strain: Not on file  Food Insecurity: Not on file  Transportation Needs: Not on file  Physical Activity: Not on file  Stress: Not on file  Social Connections: Not on file     Family History: The patient's family history includes CAD in his mother; Hypertension in his father. ROS:   Please see the history of present illness.    All other systems reviewed and are negative.  EKGs/Labs/Other Studies Reviewed:    The following studies were reviewed today:  EKG:  EKG ordered today and personally reviewed.  The ekg ordered today demonstrates sinus rhythm right bundle branch block normal QT interval  Recent Labs: No results found for requested labs within last 8760 hours.  Recent Lipid Panel    Component Value Date/Time   CHOL 194 06/18/2017 1000   TRIG 241 (H) 06/18/2017 1000   HDL 37 (L) 06/18/2017 1000   CHOLHDL 5.2 (H) 06/18/2017 1000   LDLCALC 109 (H) 06/18/2017 1000    Physical Exam:    VS:  BP (!) 160/70   Pulse 63   Ht 6\' 1"  (1.854 m)   Wt 242 lb 6.4 oz (110 kg)   SpO2 97%   BMI 31.98 kg/m     Wt Readings from Last 3 Encounters:  06/23/21 242 lb 6.4 oz (110 kg)  09/14/20 249 lb (112.9 kg)  09/02/20 249 lb 12.8 oz (113.3 kg)     GEN:  Well nourished, well developed in no acute distress HEENT: Normal NECK: No JVD; No carotid bruits LYMPHATICS: No lymphadenopathy CARDIAC: RRR, no murmurs, rubs, gallops RESPIRATORY:  Clear to auscultation  without rales, wheezing or rhonchi  ABDOMEN: Soft, non-tender, non-distended MUSCULOSKELETAL:  No edema; No deformity  SKIN: Warm and dry NEUROLOGIC:  Alert and oriented x 3 PSYCHIATRIC:  Normal affect    Signed, Shirlee More, MD  06/23/2021 11:41 AM    [place]

## 2021-06-23 NOTE — Patient Instructions (Signed)

## 2021-07-07 ENCOUNTER — Other Ambulatory Visit: Payer: Self-pay | Admitting: Cardiology

## 2021-09-05 ENCOUNTER — Other Ambulatory Visit: Payer: Self-pay | Admitting: Cardiology

## 2022-03-01 ENCOUNTER — Other Ambulatory Visit: Payer: Self-pay | Admitting: Cardiology

## 2022-04-24 NOTE — Progress Notes (Unsigned)
Cardiology Office Note:    Date:  04/25/2022   ID:  Charles Gonzalez, DOB 12-05-1950, MRN [id number]  PCP:  [facility]  Cardiologist:  Shirlee More, MD    Referring MD: Algis Greenhouse, MD    ASSESSMENT:    1. Coronary artery disease of native artery of native heart with stable angina pectoris ([place])   2. PAD (peripheral artery disease) ([place])   3. Essential hypertension   4. PSVT (paroxysmal supraventricular tachycardia) ([place])   5. Presence of permanent cardiac pacemaker   6. Hyperlipidemia, unspecified hyperlipidemia type    PLAN:    In order of problems listed above:  Kelvis continues to do well with CAD having no angina after previous PCI and on current medical therapy we will continue his long-term dual antiplatelet therapy with coincident PAD. Stable having no claudication Blood pressure is at target continue current treatment Ace inhibitor No recurrence remains on sotalol check EKG for QT interval Stable pacemaker function followed in the [facility] atrium system by EP Poorly controlled I have offered every conceivable lipid-lowering therapy and he declines previous intolerances as his recent   Next appointment: 6 months   Medication Adjustments/Labs and Tests Ordered: Current medicines are reviewed at length with the patient today.  Concerns regarding medicines are outlined above.  No orders of the defined types were placed in this encounter.  No orders of the defined types were placed in this encounter.   Chief Complaint  Patient presents with   Coronary Artery Disease   SVT   RBBB   Hyperlipidemia   Hypertension   PAD    History of Present Illness:    Charles Gonzalez is a 71 y.o. male with a hx of CAD with PCI of the right coronary artery in 1990 following SVT with bypass tract ablation 2017 and maintained on low-dose sotalol right bundle branch block dyslipidemia statin intolerance hypertensive heart disease peripheral vascular disease with  PCI of the left common iliac artery in 2001 and permanent pacemaker for bradycardia last seen 08/23/2020.  Compliance with diet, lifestyle and medications: Yes   He is receiving [facility] at the [facility].  He is an ongoing determination of extent of agent orange disability He now has neuropathy he is having no claudication chest pain edema shortness of breath palpitation or syncope He has remote device check through [place] reviewed I offered again to attempt to treat him with lipid-lowering therapy and he declines.   He had pacemaker device check [facility] atrium 01/24/2022.  [facility] 02/27/2022 A1c 6.3% LDL cholesterol 122 creatinine 1.05 potassium 4.3 EKG 6 Past Medical History:  Diagnosis Date   Acute kidney failure with acute cortical necrosis ([place]) 04/25/2017   Arthritis 04/25/2017   Benign hypertension 10/06/2016   C. difficile colitis 04/25/2017   02/2016   CAD (coronary artery disease), native coronary artery 04/19/2015   Overview:  PCI of RCA 1995   Cirrhosis ([place]) 04/25/2017   Essential hypertension 04/25/2017   Gastric ulcer 04/25/2017   High risk medication use 03/01/2016   Overview:  sotolol   Hyperlipidemia 04/19/2015   Hypertension 04/25/2017   Mixed hyperlipidemia 10/06/2016   Overview:  HIS indicated based on CHD, declines statin, felt bad, declines PCSK9i   Obesity 04/25/2017   Old MI (myocardial infarction) 04/03/1994   Pacemaker 06/08/2015   Overview:  St Jude   Pacemaker reprogramming/check 06/28/2015   PAD (peripheral artery disease) ([place]) 04/19/2015   Overview:  PCI of  L CIA 2001   Polyneuropathy in diabetes ([place]) 04/25/2017   Presence of permanent cardiac pacemaker 06/14/2015   PSVT (paroxysmal supraventricular tachycardia) ([place]) 04/19/2015   S/P ablation of accessory bypass tract 06/09/2015   S/P coronary artery stent placement 04/25/2017   Overview:  Multiple   Shortness of breath 04/25/2017   Overview:  exertional and with tachycardia    Transient complete heart block ([place]) 06/28/2015   Overview:  pacemaker   Type 2 diabetes mellitus without complication, without long-term current use of insulin ([place]) 05/02/2015   Overview:  1998: dx    Past Surgical History:  Procedure Laterality Date   ABLATION OF DYSRHYTHMIC FOCUS     ATRIAL TACH ABLATION  06/14/2015   Procedure: Atrial Tachycardia Ablation accessory pathway was close to HIS bundle, we have to approach this differently either from noncoronary cusp or left atrium NAVIX CRYO; Surgeon: Mahala Menghini, MD; Location: [facility] EP; Service: Cardiology   CHOLECYSTECTOMY     CORONARY ANGIOPLASTY WITH STENT PLACEMENT     ELECTROPHYSIOLOGIC STUDY  06/09/2015   Procedure: Comprehensive Study W IND POSS PPM BTK; Surgeon: Mahala Menghini, MD; Location: [facility] EP; Service: Cardiology    SUPRAVENTRICULAR TACHYCARDIA ABLATION  05/24/2016   Procedure: Atrial Tachycardia Ablation esi; Surgeon: Mahala Menghini, MD; Location: [facility] EP; Service: Cardiology    TONSILLECTOMY      Current Medications: Current Meds  Medication Sig   aspirin EC 81 MG tablet Take 81 mg by mouth 1 day or 1 dose.   clopidogrel (PLAVIX) 75 MG tablet TAKE ONE TABLET BY MOUTH EVERY DAY   colchicine 0.6 MG tablet Take 0.6 mg by mouth.   glipiZIDE (GLUCOTROL XL) 5 MG 24 hr tablet Take 5 mg by mouth.   lisinopril (ZESTRIL) 10 MG tablet TAKE ONE TABLET BY MOUTH EVERY DAY   metFORMIN (GLUCOPHAGE) 500 MG tablet Take 500 mg by mouth.   Multiple Vitamins-Minerals (PRESERVISION AREDS 2+MULTI VIT) CAPS Take 1 capsule by mouth 2 (two) times daily.   nitroGLYCERIN (NITROSTAT) 0.4 MG SL tablet TAKE 1 TABLET UNDER TONGUE AS NEEDED FORCHEST PAIN REPEAT IN 5 MINUTES IF NEEDED   sotalol (BETAPACE) 80 MG tablet TAKE ONE TABLET BY MOUTH TWICE DAILY     Allergies:   Atorvastatin   Social History   Socioeconomic History   Marital status: Married    Spouse name: Not on file   Number of children: Not on file   Years of education: Not on file    Highest education level: Not on file  Occupational History   Not on file  Tobacco Use   Smoking status: Former    Years: 25.00    Types: Cigarettes    Quit date: 04/19/1994    Years since quitting: 28.0    Passive exposure: Past   Smokeless tobacco: Never  Vaping Use   Vaping Use: Never used  Substance and Sexual Activity   Alcohol use: No   Drug use: No   Sexual activity: Not on file  Other Topics Concern   Not on file  Social History Narrative   Not on file   Social Determinants of Health   Financial Resource Strain: Not on file  Food Insecurity: Not on file  Transportation Needs: Not on file  Physical Activity: Not on file  Stress: Not on file  Social Connections: Not on file     Family History: The patient's family history includes CAD in his mother; Hypertension in his father. ROS:   Please see the history of  present illness.    All other systems reviewed and are negative.  EKGs/Labs/Other Studies Reviewed:    The following studies were reviewed today:  EKG:  EKG ordered today and personally reviewed.  The ekg ordered today demonstrates sinus rhythm right bundle branch block normal QT interval  Recent Labs: No results found for requested labs within last 365 days.  Recent Lipid Panel    Component Value Date/Time   CHOL 194 06/18/2017 1000   TRIG 241 (H) 06/18/2017 1000   HDL 37 (L) 06/18/2017 1000   CHOLHDL 5.2 (H) 06/18/2017 1000   LDLCALC 109 (H) 06/18/2017 1000    Physical Exam:    VS:  BP (!) 164/66 (BP Location: Right Arm, Patient Position: Sitting)   Pulse 70   Ht 6' (1.829 m)   Wt 234 lb 6.4 oz (106.3 kg)   SpO2 97%   BMI 31.79 kg/m     Wt Readings from Last 3 Encounters:  04/25/22 234 lb 6.4 oz (106.3 kg)  06/23/21 242 lb 6.4 oz (110 kg)  09/14/20 249 lb (112.9 kg)     GEN:  Well nourished, well developed in no acute distress HEENT: Normal NECK: No JVD; No carotid bruits LYMPHATICS: No lymphadenopathy CARDIAC: RRR, no murmurs,  rubs, gallops RESPIRATORY:  Clear to auscultation without rales, wheezing or rhonchi  ABDOMEN: Soft, non-tender, non-distended MUSCULOSKELETAL:  No edema; No deformity  SKIN: Warm and dry NEUROLOGIC:  Alert and oriented x 3 PSYCHIATRIC:  Normal affect    Signed, Shirlee More, MD  04/25/2022 11:19 AM    [place]

## 2022-04-25 ENCOUNTER — Encounter: Payer: Self-pay | Admitting: Cardiology

## 2022-04-25 ENCOUNTER — Ambulatory Visit: Payer: PPO | Attending: Cardiology | Admitting: Cardiology

## 2022-04-25 VITALS — BP 164/66 | HR 70 | Ht 72.0 in | Wt 234.4 lb

## 2022-04-25 DIAGNOSIS — I10 Essential (primary) hypertension: Secondary | ICD-10-CM

## 2022-04-25 DIAGNOSIS — I25118 Atherosclerotic heart disease of native coronary artery with other forms of angina pectoris: Secondary | ICD-10-CM

## 2022-04-25 DIAGNOSIS — Z950 Presence of cardiac pacemaker: Secondary | ICD-10-CM

## 2022-04-25 DIAGNOSIS — I739 Peripheral vascular disease, unspecified: Secondary | ICD-10-CM

## 2022-04-25 DIAGNOSIS — I471 Supraventricular tachycardia: Secondary | ICD-10-CM

## 2022-04-25 DIAGNOSIS — E785 Hyperlipidemia, unspecified: Secondary | ICD-10-CM

## 2022-04-25 NOTE — Patient Instructions (Addendum)
Medication Instructions:  Your physician recommends that you continue on your current medications as directed. Please refer to the Current Medication list given to you today.  *If you need a refill on your cardiac medications before your next appointment, please call your pharmacy*   Lab Work: None If you have labs (blood work) drawn today and your tests are completely normal, you will receive your results only by: MyChart Message (if you have MyChart) OR A paper copy in the mail If you have any lab test that is abnormal or we need to change your treatment, we will call you to review the results.   Testing/Procedures: None   Follow-Up: At  [facility], you and your health needs are our priority.  As part of our continuing mission to provide you with exceptional heart care, we have created designated Provider Care Teams.  These Care Teams include your primary Cardiologist (physician) and Advanced Practice Providers (APPs -  Physician Assistants and Nurse Practitioners) who all work together to provide you with the care you need, when you need it.  We recommend signing up for the patient portal called "MyChart".  Sign up information is provided on this After Visit Summary.  MyChart is used to connect with patients for Virtual Visits (Telemedicine).  Patients are able to view lab/test results, encounter notes, upcoming appointments, etc.  Non-urgent messages can be sent to your provider as well.   To learn more about what you can do with MyChart, go to [email or web address].    Your next appointment:   6 month(s)  The format for your next appointment:   In Person  Provider:   Brian Munley, MD    Other Instructions None  Important Information About Sugar          [facility]  Tips to measure your blood pressure correctly  To determine whether you have hypertension, a medical professional will take a blood pressure reading. How you prepare for the test, the  position of your arm, and other factors can change a blood pressure reading by 10% or more. That could be enough to hide high blood pressure, start you on a drug you don't really need, or lead your doctor to incorrectly adjust your medications. National and international guidelines offer specific instructions for measuring blood pressure. If a doctor, nurse, or medical assistant isn't doing it right, don't hesitate to ask him or her to get with the guidelines. Here's what you can do to ensure a correct reading:  Don't drink a caffeinated beverage or smoke during the 30 minutes before the test.  Sit quietly for five minutes before the test begins.  During the measurement, sit in a chair with your feet on the floor and your arm supported so your elbow is at about heart level.  The inflatable part of the cuff should completely cover at least 80% of your upper arm, and the cuff should be placed on bare skin, not over a shirt.  Don't talk during the measurement.  Have your blood pressure measured twice, with a brief break in between. If the readings are different by 5 points or more, have it done a third time. There are times to break these rules. If you sometimes feel lightheaded when getting out of bed in the morning or when you stand after sitting, you should have your blood pressure checked while seated and then while standing to see if it falls from one position to the next. Because blood pressure varies throughout the day, your   doctor will rarely diagnose hypertension on the basis of a single reading. Instead, he or she will want to confirm the measurements on at least two occasions, usually within a few weeks of one another. The exception to this rule is if you have a blood pressure reading of 180/110 mm Hg or higher. A result this high usually calls for prompt treatment. It's also a good idea to have your blood pressure measured in both arms at least once, since the reading in one arm (usually the  right) may be higher than that in the left. A 2014 study in The American Journal of Medicine of nearly 3,400 people found average arm- to-arm differences in systolic blood pressure of about 5 points. The higher number should be used to make treatment decisions. In 2017, new guidelines from the American Heart Association, the American College of Cardiology, and nine other health organizations lowered the diagnosis of high blood pressure to 130/80 mm Hg or higher for all adults. The guidelines also redefined the various blood pressure categories to now include normal, elevated, Stage 1 hypertension, Stage 2 hypertension, and hypertensive crisis (see "Blood pressure categories"). Blood pressure categories  Blood pressure category SYSTOLIC (upper number)  DIASTOLIC (lower number)  Normal Less than 120 mm Hg and Less than 80 mm Hg  Elevated 120-129 mm Hg and Less than 80 mm Hg  High blood pressure: Stage 1 hypertension 130-139 mm Hg or 80-89 mm Hg  High blood pressure: Stage 2 hypertension 140 mm Hg or higher or 90 mm Hg or higher  Hypertensive crisis (consult your doctor immediately) Higher than 180 mm Hg and/or Higher than 120 mm Hg  Source: American Heart Association and American Stroke Association. For more on getting your blood pressure under control, buy Controlling Your Blood Pressure, a Special Health Report from [employer].  

## 2022-05-10 ENCOUNTER — Telehealth: Payer: Self-pay

## 2022-05-10 NOTE — Telephone Encounter (Signed)
   Pre-operative Risk Assessment    Patient Name: Charles Gonzalez  DOB: [birth year] MRN: [id number]     Request for Surgical Clearance{ 1. What type of surgery is being performed? Enter name of procedure below and number of teeth if dental extraction.  Procedure:  Dental Extraction - Amount of Teeth to be Pulled:  10  Date of Surgery:  Clearance TBD                                 Surgeon:  Sander Nephew, DDS, PA Surgeon's Group or Practice Name:  [facility] Phone number:  [phone] Fax number:  [phone]   Type of Clearance Requested:   - Medical    Type of Anesthesia:  Local    Additional requests/questions:    SignedToni Arthurs   05/10/2022, 10:23 AM

## 2022-05-10 NOTE — Telephone Encounter (Signed)
Dr. Bettina Gavia, you recently saw this patient on 04/25/22 and we have now received a request for #10 dental extractions. The request to hold DAPT was not included, however it is highly likely that the dentist will request that one or both of these be held. Would you please comment on clearance to proceed with extractions and agreement hat he may hold Plavix with a request to continue on aspirin if possible.   Please route your response to p cv div preop  Thank you Emmaline Life, NP-C  05/10/2022, 11:13 AM [address] Office [phone] Fax [phone]

## 2022-05-11 NOTE — Telephone Encounter (Signed)
Susie with Dr. Everlean Cherry, DDS office called this morning in regard to clearance for the pt.   The pt was supposed to have dental procedure of 10 teeth to be extracted today under local with Epi if needed.   I informed the DDS office that the pre op provide did send a message to Dr. Bettina Gavia, the pt's cardiologist to review if Plavix ok to be held, see further notes from yesterday from Christen Bame, NP.   I stated that I will send a message to the pre op provider and Dr. Bettina Gavia to address. Pt will need to reschedule his dental procedure until he has been cleared. Susie with the dental office states the pt told her that he just saw Dr. Bettina Gavia last month and he cleared him . I assured Susie I will send to pre op apr MD to address for clearance and how long to hold Plavix. Susie, thanked me for the call and the help.

## 2022-05-12 NOTE — Telephone Encounter (Signed)
If he has already had a coronary intervention in the past 12 months he can withhold the Plavix (last intervention 2018).  Best to ask Dr. Bettina Gavia next week about resuming this.  It seems like he has had a peripheral intervention also more than a year ago. Both confirmed with the pt and no interventions since 2018. Pt cleared to hold Plavix which he has been doing since Tuesday(he states he stopped himself).

## 2022-05-15 NOTE — Telephone Encounter (Signed)
   Primary Cardiologist: Shirlee More, MD  Chart reviewed as part of pre-operative protocol coverage. Given past medical history and time since last visit, based on ACC/AHA guidelines, Charles Gonzalez would be at acceptable risk for the planned procedure without further cardiovascular testing.   He may hold Plavix for 5 days prior to procedure and should resume as soon as hemodynamically stable following procedure. Ideally aspirin should be continued without interruption, however if the bleeding risk is too great, aspirin may be held for 7 days prior to surgery. Please resume aspirin post operatively when it is felt to be safe from a bleeding standpoint.   I will route this recommendation to the requesting party via Epic fax function and remove from pre-op pool.  Please call with questions.  Emmaline Life, NP-C  05/15/2022, 6:31 AM [address], [address] Office [phone] Fax [phone]

## 2022-05-16 NOTE — Telephone Encounter (Signed)
I s/w Susie with Dr. Everlean Cherry, DDS office and confirmed they did receive clearance notes. Susie confirmed notes received. She did confirm Plavix can be held x 5 day. In regard to ASA, prefer to continue ASA, though if bleeding risk too great then can hold ASA x 7 days if needed. Susie thanked me for the call and the help.

## 2022-06-02 ENCOUNTER — Other Ambulatory Visit: Payer: Self-pay | Admitting: Cardiology

## 2022-06-02 NOTE — Telephone Encounter (Signed)
Clopidogrel refills sent to pharmacy

## 2022-10-28 NOTE — Progress Notes (Unsigned)
Cardiology Office Note:    Date:  10/30/2022   ID:  Charles Gonzalez, DOB 27-May-1951, MRN [id number]  PCP:  [facility]  Cardiologist:  Shirlee More, MD    Referring MD: [facility]    ASSESSMENT:    1. Coronary artery disease of native artery of native heart with stable angina pectoris ([place])   2. Presence of permanent cardiac pacemaker   3. PSVT (paroxysmal supraventricular tachycardia)   4. PAD (peripheral artery disease) ([place])   5. Mixed hyperlipidemia    PLAN:    In order of problems listed above:  He continues to do well with CAD following his remote PCI on long-term dual antiplatelet therapy with his combined peripheral arterial and coronary disease continue aspirin clopidogrel and is not on lipid-lowering therapy statin intolerant at his choice. Stable device function followed by [facility] along with his antiarrhythmic drug therapy for SVT Poorly controlled hyperlipidemia not on lipid-lowering therapy   Next appointment: 6 months   Medication Adjustments/Labs and Tests Ordered: Current medicines are reviewed at length with the patient today.  Concerns regarding medicines are outlined above.  No orders of the defined types were placed in this encounter.  No orders of the defined types were placed in this encounter.   Chief Complaint  Patient presents with   Follow-up   Coronary Artery Disease    History of Present Illness:    Charles Gonzalez is a 71 y.o. male with a hx of CAD with PCI of the right coronary artery in 1990 SVT with bypass tract ablation 2017 subsequent maintained on low-dose sotalol right bundle branch block dyslipidemia statin intolerance and has declined alternate lipid-lowering therapy hypertensive heart disease peripheral vascular disease with PCI left common iliac artery in 2001 and permanent pacemaker for symptomatic bradycardia followed by device clinic [facility] last seen 04/25/2022.  Compliance with  diet, lifestyle and medications: Yes  He continues to follow the [facility].  His description I suspect he has a monoclonal gammopathy. 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 He has an appointment in the next week or 2 to follow-up with device clinic [facility]. Past Medical History:  Diagnosis Date   Acute kidney failure with acute cortical necrosis ([place]) 04/25/2017   Arthritis 04/25/2017   Benign hypertension 10/06/2016   C. difficile colitis 04/25/2017   02/2016   CAD (coronary artery disease), native coronary artery 04/19/2015   Overview:  PCI of RCA 1995   Cirrhosis ([place]) 04/25/2017   Essential hypertension 04/25/2017   Gastric ulcer 04/25/2017   High risk medication use 03/01/2016   Overview:  sotolol   Hyperlipidemia 04/19/2015   Hypertension 04/25/2017   Mixed hyperlipidemia 10/06/2016   Overview:  HIS indicated based on CHD, declines statin, felt bad, declines PCSK9i   Obesity 04/25/2017   Old MI (myocardial infarction) 04/03/1994   Pacemaker 06/08/2015   Overview:  St Jude   Pacemaker reprogramming/check 06/28/2015   PAD (peripheral artery disease) ([place]) 04/19/2015   Overview:  PCI of L CIA 2001   Polyneuropathy in diabetes ([place]) 04/25/2017   Presence of permanent cardiac pacemaker 06/14/2015   PSVT (paroxysmal supraventricular tachycardia) 04/19/2015   S/P ablation of accessory bypass tract 06/09/2015   S/P coronary artery stent placement 04/25/2017   Overview:  Multiple   Shortness of breath 04/25/2017   Overview:  exertional and with tachycardia  Transient complete heart block ([place]) 06/28/2015   Overview:  pacemaker   Type 2 diabetes mellitus without complication, without long-term current use of insulin ([place]) 05/02/2015   Overview:  1998: dx    Past Surgical History:  Procedure Laterality Date   ABLATION OF DYSRHYTHMIC FOCUS     ATRIAL TACH  ABLATION  06/14/2015   Procedure: Atrial Tachycardia Ablation accessory pathway was close to HIS bundle, we have to approach this differently either from noncoronary cusp or left atrium NAVIX CRYO; Surgeon: Mahala Menghini, MD; Location: [facility] EP; Service: Cardiology   CHOLECYSTECTOMY     CORONARY ANGIOPLASTY WITH STENT PLACEMENT     ELECTROPHYSIOLOGIC STUDY  06/09/2015   Procedure: Comprehensive Study W IND POSS PPM BTK; Surgeon: Mahala Menghini, MD; Location: [facility] EP; Service: Cardiology    SUPRAVENTRICULAR TACHYCARDIA ABLATION  05/24/2016   Procedure: Atrial Tachycardia Ablation esi; Surgeon: Mahala Menghini, MD; Location: [facility] EP; Service: Cardiology    TONSILLECTOMY      Current Medications: Current Meds  Medication Sig   aspirin EC 81 MG tablet Take 81 mg by mouth 1 day or 1 dose.   colchicine 0.6 MG tablet Take 0.6 mg by mouth.   glipiZIDE (GLUCOTROL XL) 5 MG 24 hr tablet Take 5 mg by mouth.   metFORMIN (GLUCOPHAGE) 500 MG tablet Take 500 mg by mouth.   Multiple Vitamins-Minerals (PRESERVISION AREDS 2+MULTI VIT) CAPS Take 1 capsule by mouth 2 (two) times daily.   nitroGLYCERIN (NITROSTAT) 0.4 MG SL tablet TAKE 1 TABLET UNDER TONGUE AS NEEDED FORCHEST PAIN REPEAT IN 5 MINUTES IF NEEDED   [DISCONTINUED] clopidogrel (PLAVIX) 75 MG tablet TAKE ONE TABLET BY MOUTH EVERY DAY   [DISCONTINUED] lisinopril (ZESTRIL) 10 MG tablet TAKE ONE TABLET BY MOUTH EVERY DAY   [DISCONTINUED] sotalol (BETAPACE) 80 MG tablet TAKE ONE TABLET BY MOUTH TWICE DAILY     Allergies:   Atorvastatin   Social History   Socioeconomic History   Marital status: Married    Spouse name: Not on file   Number of children: Not on file   Years of education: Not on file   Highest education level: Not on file  Occupational History   Not on file  Tobacco Use   Smoking status: Former    Years: 27    Types: Cigarettes    Quit date: 04/19/1994    Years since quitting: 28.5    Passive exposure: Past   Smokeless tobacco: Never   Vaping Use   Vaping Use: Never used  Substance and Sexual Activity   Alcohol use: No   Drug use: No   Sexual activity: Not on file  Other Topics Concern   Not on file  Social History Narrative   Not on file   Social Determinants of Health   Financial Resource Strain: Not on file  Food Insecurity: Not on file  Transportation Needs: Not on file  Physical Activity: Not on file  Stress: Not on file  Social Connections: Not on file     Family History: The patient's family history includes CAD in his mother; Hypertension in his father. ROS:   Please see the history of present illness.    All other systems reviewed and are negative.  EKGs/Labs/Other Studies Reviewed:    The following studies were reviewed today:  Cardiac Studies & Procedures     STRESS TESTS  MYOCARDIAL PERFUSION IMAGING 09/14/2020  Narrative  The left ventricular ejection fraction is normal (55-65%).  Nuclear stress EF: 60%.  There was no  ST segment deviation noted during stress.  Defect 1: There is a medium defect of moderate severity present in the basal inferior, mid inferior and apical inferior location.  The study is normal.  This is a low risk study.              EKG:  EKG EKG last visit showed sinus rhythm right bundle branch block  Recent Labs: No results found for requested labs within last 365 days.  Recent Lipid Panel    Component Value Date/Time   CHOL 194 06/18/2017 1000   TRIG 241 (H) 06/18/2017 1000   HDL 37 (L) 06/18/2017 1000   CHOLHDL 5.2 (H) 06/18/2017 1000   LDLCALC 109 (H) 06/18/2017 1000    Physical Exam:    VS:  BP 138/64 (BP Location: Left Arm, Patient Position: Sitting)   Pulse 78   Ht 6' (1.829 m)   Wt 218 lb 12.8 oz (99.2 kg)   SpO2 97%   BMI 29.67 kg/m     Wt Readings from Last 3 Encounters:  10/30/22 218 lb 12.8 oz (99.2 kg)  04/25/22 234 lb 6.4 oz (106.3 kg)  06/23/21 242 lb 6.4 oz (110 kg)     GEN:  Well nourished, well developed in no acute  distress HEENT: Normal NECK: No JVD; No carotid bruits LYMPHATICS: No lymphadenopathy CARDIAC: RRR, no murmurs, rubs, gallops RESPIRATORY:  Clear to auscultation without rales, wheezing or rhonchi  ABDOMEN: Soft, non-tender, non-distended MUSCULOSKELETAL:  No edema; No deformity  SKIN: Warm and dry NEUROLOGIC:  Alert and oriented x 3 PSYCHIATRIC:  Normal affect    Signed, Shirlee More, MD  10/30/2022 11:35 AM    [place]

## 2022-10-30 ENCOUNTER — Encounter: Payer: Self-pay | Admitting: Cardiology

## 2022-10-30 ENCOUNTER — Ambulatory Visit: Payer: PPO | Attending: Cardiology | Admitting: Cardiology

## 2022-10-30 ENCOUNTER — Other Ambulatory Visit: Payer: Self-pay

## 2022-10-30 VITALS — BP 138/64 | HR 78 | Ht 72.0 in | Wt 218.8 lb

## 2022-10-30 DIAGNOSIS — I739 Peripheral vascular disease, unspecified: Secondary | ICD-10-CM

## 2022-10-30 DIAGNOSIS — I25118 Atherosclerotic heart disease of native coronary artery with other forms of angina pectoris: Secondary | ICD-10-CM

## 2022-10-30 DIAGNOSIS — Z950 Presence of cardiac pacemaker: Secondary | ICD-10-CM | POA: Diagnosis not present

## 2022-10-30 DIAGNOSIS — I471 Supraventricular tachycardia, unspecified: Secondary | ICD-10-CM | POA: Diagnosis not present

## 2022-10-30 DIAGNOSIS — E782 Mixed hyperlipidemia: Secondary | ICD-10-CM

## 2022-10-30 MED ORDER — SOTALOL HCL 80 MG PO TABS: 80.0000 mg | ORAL_TABLET | Freq: Two times a day (BID) | ORAL | 3 refills | Status: DC

## 2022-10-30 MED ORDER — CLOPIDOGREL BISULFATE 75 MG PO TABS: 75.0000 mg | ORAL_TABLET | Freq: Every day | ORAL | 3 refills | Status: DC

## 2022-10-30 MED ORDER — LISINOPRIL 10 MG PO TABS: 10.0000 mg | ORAL_TABLET | Freq: Every day | ORAL | 3 refills | Status: DC

## 2022-10-30 NOTE — Patient Instructions (Signed)
Medication Instructions:  Your physician recommends that you continue on your current medications as directed. Please refer to the Current Medication list given to you today.  *If you need a refill on your cardiac medications before your next appointment, please call your pharmacy*   Lab Work: None If you have labs (blood work) drawn today and your tests are completely normal, you will receive your results only by: MyChart Message (if you have MyChart) OR A paper copy in the mail If you have any lab test that is abnormal or we need to change your treatment, we will call you to review the results.   Testing/Procedures: None   Follow-Up: At [facility], you and your health needs are our priority.  As part of our continuing mission to provide you with exceptional heart care, we have created designated Provider Care Teams.  These Care Teams include your primary Cardiologist (physician) and Advanced Practice Providers (APPs -  Physician Assistants and Nurse Practitioners) who all work together to provide you with the care you need, when you need it.  We recommend signing up for the patient portal called "MyChart".  Sign up information is provided on this After Visit Summary.  MyChart is used to connect with patients for Virtual Visits (Telemedicine).  Patients are able to view lab/test results, encounter notes, upcoming appointments, etc.  Non-urgent messages can be sent to your provider as well.   To learn more about what you can do with MyChart, go to [email or web address].    Your next appointment:   6 month(s)  Provider:   Brian Munley, MD    Other Instructions None  

## 2023-05-07 NOTE — Progress Notes (Unsigned)
Cardiology Office Note:    Date:  05/07/2023   ID:  Charles Gonzalez, DOB 18-Jun-1951, MRN [id number]  [facility]P:  [facility]  Cardiologist:  Norman Herrlich, MD    Referring MD: [facility]    ASSESSMENT:    No diagnosis found. PLAN:    In order of problems listed above:  ***   Next appointment: ***   Medication Adjustments/Labs and Tests Ordered: Current medicines are reviewed at length with the patient today.  Concerns regarding medicines are outlined above.  No orders of the defined types were placed in this encounter.  No orders of the defined types were placed in this encounter.    History of Present Illness:    Charles Gonzalez is a 72 y.o. male with a hx of *** last seen ***. Compliance with diet, lifestyle and medications: *** Past Medical History:  Diagnosis Date   Acute kidney failure with acute cortical necrosis (HCC) 04/25/2017   Arthritis 04/25/2017   Benign hypertension 10/06/2016   C. difficile colitis 04/25/2017   02/2016   CAD (coronary artery disease), native coronary artery 04/19/2015   Overview:  PCI of RCA 1995   Cirrhosis (HCC) 04/25/2017   Essential hypertension 04/25/2017   Gastric ulcer 04/25/2017   High risk medication use 03/01/2016   Overview:  sotolol   Hyperlipidemia 04/19/2015   Hypertension 04/25/2017   Mixed hyperlipidemia 10/06/2016   Overview:  HIS indicated based on CHD, declines statin, felt bad, declines PCSK9i   Obesity 04/25/2017   Old MI (myocardial infarction) 04/03/1994   Pacemaker 06/08/2015   Overview:  St Jude   Pacemaker reprogramming/check 06/28/2015   PAD (peripheral artery disease) (HCC) 04/19/2015   Overview:  PCI of L CIA 2001   Polyneuropathy in diabetes (HCC) 04/25/2017   Presence of permanent cardiac pacemaker 06/14/2015   PSVT (paroxysmal supraventricular tachycardia) 04/19/2015   S/P ablation of accessory bypass tract 06/09/2015   S/P coronary artery stent placement 04/25/2017   Overview:  Multiple    Shortness of breath 04/25/2017   Overview:  exertional and with tachycardia   Transient complete heart block (HCC) 06/28/2015   Overview:  pacemaker   Type 2 diabetes mellitus without complication, without long-term current use of insulin (HCC) 05/02/2015   Overview:  1998: dx    Current Medications: No outpatient medications have been marked as taking for the 05/08/23 encounter (Appointment) with Baldo Daub, MD.      EKGs/Labs/Other Studies Reviewed:    The following studies were reviewed today:  Cardiac Studies & Procedures     STRESS TESTS  MYOCARDIAL PERFUSION IMAGING 09/14/2020  Narrative  The left ventricular ejection fraction is normal (55-65%).  Nuclear stress EF: 60%.  There was no ST segment deviation noted during stress.  Defect 1: There is a medium defect of moderate severity present in the basal inferior, mid inferior and apical inferior location.  The study is normal.  This is a low risk study.                  Recent Labs: No results found for requested labs within last 365 days.  Recent Lipid Panel    Component Value Date/Time   CHOL 194 06/18/2017 1000   TRIG 241 (H) 06/18/2017 1000   HDL 37 (L) 06/18/2017 1000   CHOLHDL 5.2 (H) 06/18/2017 1000   LDLCALC 109 (H) 06/18/2017 1000    Physical Exam:    VS:  There were no vitals taken for this visit.    Wt  Readings from Last 3 Encounters:  10/30/22 218 lb 12.8 oz (99.2 kg)  04/25/22 234 lb 6.4 oz (106.3 kg)  06/23/21 242 lb 6.4 oz (110 kg)     GEN: *** Well nourished, well developed in no acute distress HEENT: Normal NECK: No JVD; No carotid bruits LYMPHATICS: No lymphadenopathy CARDIAC: ***RRR, no murmurs, rubs, gallops RESPIRATORY:  Clear to auscultation without rales, wheezing or rhonchi  ABDOMEN: Soft, non-tender, non-distended MUSCULOSKELETAL:  No edema; No deformity  SKIN: Warm and dry NEUROLOGIC:  Alert and oriented x 3 PSYCHIATRIC:  Normal affect    Signed, Norman Herrlich,  MD  05/07/2023 2:59 PM    [facility]

## 2023-05-08 ENCOUNTER — Encounter: Payer: Self-pay | Admitting: Cardiology

## 2023-05-08 ENCOUNTER — Ambulatory Visit: Payer: PPO | Attending: Cardiology | Admitting: Cardiology

## 2023-05-08 VITALS — BP 130/90 | HR 62 | Ht 71.0 in | Wt 213.2 lb

## 2023-05-08 DIAGNOSIS — I10 Essential (primary) hypertension: Secondary | ICD-10-CM | POA: Diagnosis not present

## 2023-05-08 DIAGNOSIS — I739 Peripheral vascular disease, unspecified: Secondary | ICD-10-CM

## 2023-05-08 DIAGNOSIS — I471 Supraventricular tachycardia, unspecified: Secondary | ICD-10-CM

## 2023-05-08 DIAGNOSIS — I25118 Atherosclerotic heart disease of native coronary artery with other forms of angina pectoris: Secondary | ICD-10-CM | POA: Diagnosis not present

## 2023-05-08 DIAGNOSIS — E782 Mixed hyperlipidemia: Secondary | ICD-10-CM

## 2023-05-08 DIAGNOSIS — Z950 Presence of cardiac pacemaker: Secondary | ICD-10-CM

## 2023-05-08 MED ORDER — NITROGLYCERIN 0.4 MG SL SUBL: 0.4000 mg | SUBLINGUAL_TABLET | SUBLINGUAL | 3 refills | Status: AC | PRN

## 2023-05-08 NOTE — Patient Instructions (Signed)
Medication Instructions:  Your physician recommends that you continue on your current medications as directed. Please refer to the Current Medication list given to you today.  *If you need a refill on your cardiac medications before your next appointment, please call your pharmacy*   Lab Work: None If you have labs (blood work) drawn today and your tests are completely normal, you will receive your results only by: MyChart Message (if you have MyChart) OR A paper copy in the mail If you have any lab test that is abnormal or we need to change your treatment, we will call you to review the results.   Testing/Procedures: None   Follow-Up: At [facility], you and your health needs are our priority.  As part of our continuing mission to provide you with exceptional heart care, we have created designated Provider Care Teams.  These Care Teams include your primary Cardiologist (physician) and Advanced Practice Providers (APPs -  Physician Assistants and Nurse Practitioners) who all work together to provide you with the care you need, when you need it.  We recommend signing up for the patient portal called "MyChart".  Sign up information is provided on this After Visit Summary.  MyChart is used to connect with patients for Virtual Visits (Telemedicine).  Patients are able to view lab/test results, encounter notes, upcoming appointments, etc.  Non-urgent messages can be sent to your provider as well.   To learn more about what you can do with MyChart, go to [email or web address].    Your next appointment:   6 month(s)  Provider:   Brian Munley, MD    Other Instructions None  

## 2023-11-09 ENCOUNTER — Other Ambulatory Visit: Payer: Self-pay

## 2023-11-13 ENCOUNTER — Ambulatory Visit

## 2023-11-13 ENCOUNTER — Ambulatory Visit: Payer: PPO | Admitting: Cardiology

## 2023-11-13 VITALS — BP 132/62 | HR 66 | Ht 73.0 in | Wt 208.4 lb

## 2023-11-13 DIAGNOSIS — I471 Supraventricular tachycardia, unspecified: Secondary | ICD-10-CM

## 2023-11-13 DIAGNOSIS — I25118 Atherosclerotic heart disease of native coronary artery with other forms of angina pectoris: Secondary | ICD-10-CM | POA: Diagnosis not present

## 2023-11-13 DIAGNOSIS — E782 Mixed hyperlipidemia: Secondary | ICD-10-CM | POA: Diagnosis not present

## 2023-11-13 DIAGNOSIS — Z950 Presence of cardiac pacemaker: Secondary | ICD-10-CM

## 2023-11-13 DIAGNOSIS — I10 Essential (primary) hypertension: Secondary | ICD-10-CM

## 2023-11-13 NOTE — Assessment & Plan Note (Addendum)
 Continues to follow-up with EP Dr. Jeanne Ivan at [facility]. Mentions he has an upcoming visit. No significant burden on last device check in March. Continues on low-dose sotalol 80 mg twice daily. Remains in sinus rhythm and QT interval at baseline, Qtc with underlying RBBB. Follow-up basic metabolic panel to check up on renal function given he is on sotalol.

## 2023-11-13 NOTE — Assessment & Plan Note (Signed)
 S/p PCI of RCA 9095. Last stress test with nuclear imaging February 2022 no ischemia. Remains asymptomatic. At times shortness of breath over the past couple months attributing to pollen.  Functional status limited due to muscle and joint aches although tries to keep walking routinely at home.  Does get winded walking up to 500 yards on his driveway.  Will obtain a transthoracic echocardiogram for baseline assessment of cardiac structure and function given his last echocardiogram was in 2021.

## 2023-11-13 NOTE — Patient Instructions (Signed)
 Medication Instructions:  Your physician recommends that you continue on your current medications as directed. Please refer to the Current Medication list given to you today.  *If you need a refill on your cardiac medications before your next appointment, please call your pharmacy*   Lab Work: Your physician recommends that you have labs done in the office today. Your test included  basic metabolic panel  If you have labs (blood work) drawn today and your tests are completely normal, you will receive your results only by: MyChart Message (if you have MyChart) OR A paper copy in the mail If you have any lab test that is abnormal or we need to change your treatment, we will call you to review the results.   Testing/Procedures: Echocardiogram An echocardiogram is a test that uses sound waves (ultrasound) to produce images of the heart. Images from an echocardiogram can provide important information about: Heart size and shape. The size and thickness and movement of your heart's walls. Heart muscle function and strength. Heart valve function or if you have stenosis. Stenosis is when the heart valves are too narrow. If blood is flowing backward through the heart valves (regurgitation). A tumor or infectious growth around the heart valves. Areas of heart muscle that are not working well because of poor blood flow or injury from a heart attack. Aneurysm detection. An aneurysm is a weak or damaged part of an artery wall. The wall bulges out from the normal force of blood pumping through the body. Tell a health care provider about: Any allergies you have. All medicines you are taking, including vitamins, herbs, eye drops, creams, and over-the-counter medicines. Any blood disorders you have. Any surgeries you have had. Any medical conditions you have. Whether you are pregnant or may be pregnant. What are the risks? Generally, this is a safe test. However, problems may occur, including an  allergic reaction to dye (contrast) that may be used during the test. What happens before the test? No specific preparation is needed. You may eat and drink normally. What happens during the test?  You will take off your clothes from the waist up and put on a hospital gown. Electrodes or electrocardiogram (ECG)patches may be placed on your chest. The electrodes or patches are then connected to a device that monitors your heart rate and rhythm. You will lie down on a table for an ultrasound exam. A gel will be applied to your chest to help sound waves pass through your skin. A handheld device, called a transducer, will be pressed against your chest and moved over your heart. The transducer produces sound waves that travel to your heart and bounce back (or "echo" back) to the transducer. These sound waves will be captured in real-time and changed into images of your heart that can be viewed on a video monitor. The images will be recorded on a computer and reviewed by your health care provider. You may be asked to change positions or hold your breath for a short time. This makes it easier to get different views or better views of your heart. In some cases, you may receive contrast through an IV in one of your veins. This can improve the quality of the pictures from your heart. The procedure may vary among health care providers and hospitals. What can I expect after the test? You may return to your normal, everyday life, including diet, activities, and medicines, unless your health care provider tells you not to do that. Follow these instructions at  home: It is up to you to get the results of your test. Ask your health care provider, or the department that is doing the test, when your results will be ready. Keep all follow-up visits. This is important. Summary An echocardiogram is a test that uses sound waves (ultrasound) to produce images of the heart. Images from an echocardiogram can provide  important information about the size and shape of your heart, heart muscle function, heart valve function, and other possible heart problems. You do not need to do anything to prepare before this test. You may eat and drink normally. After the echocardiogram is completed, you may return to your normal, everyday life, unless your health care provider tells you not to do that. This information is not intended to replace advice given to you by your health care provider. Make sure you discuss any questions you have with your health care provider. Document Revised: 04/06/2021 Document Reviewed: 03/16/2020 Elsevier Patient Education  2023 Elsevier Inc.        Follow-Up: At [facility], you and your health needs are our priority.  As part of our continuing mission to provide you with exceptional heart care, we have created designated Provider Care Teams.  These Care Teams include your primary Cardiologist (physician) and Advanced Practice Providers (APPs -  Physician Assistants and Nurse Practitioners) who all work together to provide you with the care you need, when you need it.  We recommend signing up for the patient portal called "MyChart".  Sign up information is provided on this After Visit Summary.  MyChart is used to connect with patients for Virtual Visits (Telemedicine).  Patients are able to view lab/test results, encounter notes, upcoming appointments, etc.  Non-urgent messages can be sent to your provider as well.   To learn more about what you can do with MyChart, go to [email or web address].    Your next appointment:   6 month follow up

## 2023-11-13 NOTE — Assessment & Plan Note (Signed)
 Has follow-up with EP at [facility] with Dr. Jeanne Ivan.  Last device check March reviewed as below in HPI.  Has a follow-up visit coming up

## 2023-11-13 NOTE — Progress Notes (Signed)
 Cardiology Consultation:    Date:  11/13/2023   ID:  [facility], DOB 11-15-1950, MRN [id number]  PCP:  [facility]  Cardiologist:  Charles Corporal Loki Wuthrich, MD   Referring MD: [facility]   No chief complaint on file.    ASSESSMENT AND PLAN:   [name] 73 year old male with history of CAD s/p PCI of RCA in 1995 and no ischemia on last stress test with nuclear imaging from February 2022, SVT s/p ablation in 2017 by Dr. Rudolpho [facility]  and has been on low-dose sotalol, s/p permanent pacemaker implant in the setting of symptomatic bradycardia, right bundle branch block, dyslipidemia, statin intolerance, hypertension, peripheral vascular disease s/p angioplasty of left common iliac in 2001, diabetes mellitus, peptic ulcer disease, cirrhosis, Gout, monoclonal gammopathy of undetermined significance, last echocardiogram to review is from February 2021 with normal LVEF 55 to 60%, normal RV size and function, no significant valve abnormalities and last pacer check from March 2025 with atrial pacing 15% and ventricular pacing 4.7% in the short atrial tachycardia episode lasting for seconds.  Pending follow-up with electrophysiologist Dr. Jeanne Gonzalez.  He requested his DMV paperwork for parking sticker and given his inability to walk 200 feet without getting out of breath and given his musculoskeletal issues limiting him from walking signed the paperwork.  Problem List Items Addressed This Visit     CAD (coronary artery disease), native coronary artery - Primary   S/p PCI of RCA 9095. Last stress test with nuclear imaging February 2022 no ischemia. Remains asymptomatic. At times shortness of breath over the past couple months attributing to pollen.  Functional status limited due to muscle and joint aches although tries to keep walking routinely at home.  Does get winded walking up to 500 yards on his driveway.  Will obtain a transthoracic echocardiogram for baseline assessment of  cardiac structure and function given his last echocardiogram was in 2021.        Relevant Orders   EKG 12-Lead (Completed)   Hyperlipidemia   Does not want to be on lipid-lowering medications statins due to reported intolerance and does not want to consider any other medications.       PSVT (paroxysmal supraventricular tachycardia) (HCC)   Continues to follow-up with EP Dr. Jeanne [facility] at [facility]. Mentions he has an upcoming visit. No significant burden on last device check in March. Continues on low-dose sotalol 80 mg twice daily. Remains in sinus rhythm and QT interval at baseline, Qtc with underlying RBBB. Follow-up basic metabolic panel to check up on renal function given he is on sotalol.       Relevant Orders   ECHOCARDIOGRAM COMPLETE   Basic metabolic panel with GFR   Pacemaker   Has follow-up with EP at [facility] with Dr. Jeanne [facility].  Last device check March reviewed as below in HPI.  Has a follow-up visit coming up       Benign hypertension   Well-controlled.  Target below 130/80 mmHg. Continue his current medication lisinopril 10 mg once daily. Sotalol which he is on for antiarrhythmic purposes also contributing to degree of blood pressure control.       Relevant Orders   ECHOCARDIOGRAM COMPLETE   Basic metabolic panel with GFR   Return to clinic tentatively in 6 months   History of Present Illness:    [facility] is a 73 y.o. male who is being seen today for follow-up visit. Last visit with our office was 05-08-2023 with [facility]. [facility] Also follows up with [facility] for EP at [facility]  History of CAD s/p PCI of RCA in 1995 and no ischemia on last stress test with nuclear imaging from February 2022, SVT s/p ablation in 2017 by Dr. Rudolpho Gonzalez  and has been on low-dose sotalol, s/p permanent pacemaker implant in the setting of symptomatic bradycardia, right bundle branch block, dyslipidemia, statin intolerance,  hypertension, peripheral vascular disease s/p angioplasty of left common iliac in 2001, diabetes mellitus, peptic ulcer disease, cirrhosis, gout, monoclonal gammopathy of undetermined significance, last echocardiogram to review is from February 2021 with normal LVEF 55 to 60%, normal RV size and function, no significant valve abnormalities  Follows up with [facility] for device check, last device check 10-23-2023 with atrial paced 15%, ventricular paced 4.7%, RV lead noise episodes noted.  1 atrial tachycardia episode lasting 4 seconds.  Mentions he has been doing well and at his baseline.  Able to walk and do his day-to-day activities at home.  Tries to keep himself busy.  No regular exercise but does stay active going for a walk regularly.  Denies any chest pain, shortness of breath, orthopnea, paroxysmal nocturnal dyspnea.  Denies any palpitations, lightheadedness or syncopal episodes.  Feels that pollen in the air recently over the past month at times affects his breathing but that relieves quickly.  Does take his medications consistently.    Last blood work available is from 01/16/2023 with BUN 12, creatinine 0.9, EGFR greater than 90.  EKG today in the clinic shows sinus rhythm with demand atrial pacing heart rate 66/min, isolated PVC noted, PR interval normal 168 ms, QRS duration 148 ms consistent with right bundle branch block morphology, QTc 480 ms, no significant change compared to Prior EKG for review from 05/08/2023 with sinus rhythm 62/min, PR interval 188 ms and QRS RBBB morphology 144 ms duration.   Past Medical History:  Diagnosis Date   Acute kidney failure with acute cortical necrosis (HCC) 04/25/2017   Arthritis 04/25/2017   Benign hypertension 10/06/2016   C. difficile colitis 04/25/2017   02/2016   CAD (coronary artery disease), native coronary artery 04/19/2015   Overview:  PCI of RCA 1995   Cirrhosis (HCC) 04/25/2017   Essential hypertension 04/25/2017   Gastric ulcer 04/25/2017    High risk medication use 03/01/2016   Overview:  sotolol   Hyperlipidemia 04/19/2015   Hypertension 04/25/2017   Mixed hyperlipidemia 10/06/2016   Overview:  HIS indicated based on CHD, declines statin, felt bad, declines [id number]   Obesity 04/25/2017   Old MI (myocardial infarction) 04/03/1994   Pacemaker 06/08/2015   Overview:  St Jude   Pacemaker reprogramming/check 06/28/2015   PAD (peripheral artery disease) (HCC) 04/19/2015   Overview:  PCI of L CIA 2001   Polyneuropathy in diabetes (HCC) 04/25/2017   Presence of permanent cardiac pacemaker 06/14/2015   PSVT (paroxysmal supraventricular tachycardia) (HCC) 04/19/2015   S/P ablation of accessory bypass tract 06/09/2015   S/P coronary artery stent placement 04/25/2017   Overview:  Multiple   Shortness of breath 04/25/2017   Overview:  exertional and with tachycardia   Transient complete heart block (HCC) 06/28/2015   Overview:  pacemaker   Type 2 diabetes mellitus without complication, without long-term current use of insulin (HCC) 05/02/2015   Overview:  1998: dx    Past Surgical History:  Procedure Laterality Date   ABLATION OF DYSRHYTHMIC FOCUS     ATRIAL TACH ABLATION  06/14/2015   Procedure: Atrial Tachycardia Ablation accessory  pathway was close to HIS bundle, we have to approach this differently either from noncoronary cusp or left Charles NAVIX CRYO; Surgeon: Sandy Salaam, MD; Location: [facility]; Service: Cardiology   CHOLECYSTECTOMY     CORONARY ANGIOPLASTY WITH STENT PLACEMENT     ELECTROPHYSIOLOGIC STUDY  06/09/2015   Procedure: Comprehensive Study W IND POSS PPM BTK; Surgeon: Sandy Salaam, MD; Location: [facility]; Service: Cardiology    SUPRAVENTRICULAR TACHYCARDIA ABLATION  05/24/2016   Procedure: Atrial Tachycardia Ablation esi; Surgeon: Sandy Salaam, MD; Location: [facility]; Service: Cardiology    TONSILLECTOMY      Current Medications: Current Meds  Medication Sig   aspirin EC 81 MG tablet Take 81 mg by mouth daily.   clopidogrel  (PLAVIX) 75 MG tablet Take 1 tablet (75 mg total) by mouth daily.   colchicine 0.6 MG tablet Take 0.6 mg by mouth as needed (for gout flare ups).   lisinopril (ZESTRIL) 10 MG tablet Take 1 tablet (10 mg total) by mouth daily.   metFORMIN (GLUCOPHAGE) 500 MG tablet Take 500 mg by mouth daily with breakfast.   Multiple Vitamins-Minerals (PRESERVISION AREDS 2+MULTI VIT) CAPS Take 1 capsule by mouth 2 (two) times daily.   nitroGLYCERIN (NITROSTAT) 0.4 MG SL tablet Place 1 tablet (0.4 mg total) under the tongue every 5 (five) minutes as needed for chest pain.   sotalol (BETAPACE) 80 MG tablet Take 1 tablet (80 mg total) by mouth 2 (two) times daily.     Allergies:   Atorvastatin   Social History   Socioeconomic History   Marital status: Married    Spouse name: Not on file   Number of children: Not on file   Years of education: Not on file   Highest education level: Not on file  Occupational History   Not on file  Tobacco Use   Smoking status: Former    Current packs/day: 0.00    Types: Cigarettes    Start date: 04/19/1969    Quit date: 04/19/1994    Years since quitting: 29.5    Passive exposure: Past   Smokeless tobacco: Never  Vaping Use   Vaping status: Never Used  Substance and Sexual Activity   Alcohol use: No   Drug use: No   Sexual activity: Not on file  Other Topics Concern   Not on file  Social History Narrative   Not on file   Social Drivers of Gonzalez   Financial Resource Strain: Not on file  Food Insecurity: Not on file  Transportation Needs: Not on file  Physical Activity: Not on file  Stress: Not on file  Social Connections: Not on file     Family History: The patient's family history includes CAD in his mother; Hypertension in his father. ROS:   Please see the history of present illness.    All 14 point review of systems negative except as described per history of present illness.  EKGs/Labs/Other Studies Reviewed:    The following studies were  reviewed today:   EKG:  EKG Interpretation Date/Time:  Tuesday November 13 2023 13:15:24 EDT Ventricular Rate:  66 PR Interval:  168 QRS Duration:  148 QT Interval:  458 QTC Calculation: 480 R Axis:   84  Text Interpretation: Sinus rhythm with occasional Premature ventricular complexes and atrial paced beat Right bundle branch block Abnormal ECG When compared with ECG of 08-May-2023 10:36, Premature ventricular complexes are now Present Confirmed by Huntley Dec reddy [phone]) on 11/13/2023 1:22:24 PM    Recent Labs: No  results found for requested labs within last 365 days.  Recent Lipid Panel    Component Value Date/Time   CHOL 194 06/18/2017 1000   TRIG 241 (H) 06/18/2017 1000   HDL 37 (L) 06/18/2017 1000   CHOLHDL 5.2 (H) 06/18/2017 1000   LDLCALC 109 (H) 06/18/2017 1000    Physical Exam:    VS:  BP 132/62   Pulse 66   Ht 6\' 1"  (1.854 m)   Wt 208 lb 6.4 oz (94.5 kg)   SpO2 97%   BMI 27.50 kg/m     Wt Readings from Last 3 Encounters:  11/13/23 208 lb 6.4 oz (94.5 kg)  05/08/23 213 lb 3.2 oz (96.7 kg)  10/30/22 218 lb 12.8 oz (99.2 kg)     GENERAL:  Well nourished, well developed in no acute distress NECK: No JVD; No carotid bruits CARDIAC: RRR, S1 and S2 present, no murmurs, no rubs, no gallops.  Pacemaker generator palpable in the left infraclavicular space  CHEST:  Clear to auscultation without rales, wheezing or rhonchi  Extremities: No pitting pedal edema. Pulses bilaterally symmetric with radial 2+ and dorsalis pedis 2+ NEUROLOGIC:  Alert and oriented x 3  Medication Adjustments/Labs and Tests Ordered: Current medicines are reviewed at length with the patient today.  Concerns regarding medicines are outlined above.  Orders Placed This Encounter  Procedures   Basic metabolic panel with GFR   EKG [id number]   ECHOCARDIOGRAM COMPLETE   No orders of the defined types were placed in this encounter.   Signed, Cecille Amsterdam, MD, MPH,  [employer] [facility]. 11/13/2023 1:23 PM    [facility]

## 2023-11-13 NOTE — Assessment & Plan Note (Signed)
 Well-controlled.  Target below 130/80 mmHg. Continue his current medication lisinopril 10 mg once daily. Sotalol which he is on for antiarrhythmic purposes also contributing to degree of blood pressure control.

## 2023-11-13 NOTE — Assessment & Plan Note (Signed)
 Does not want to be on lipid-lowering medications statins due to reported intolerance and does not want to consider any other medications.

## 2023-11-14 LAB — BASIC METABOLIC PANEL WITH GFR
BUN/Creatinine Ratio: 13 (ref 10–24)
BUN: 13 mg/dL (ref 8–27)
CO2: 24 mmol/L (ref 20–29)
Calcium: 8.9 mg/dL (ref 8.6–10.2)
Chloride: 102 mmol/L (ref 96–106)
Creatinine, Ser: 0.97 mg/dL (ref 0.76–1.27)
Glucose: 104 mg/dL — ABNORMAL HIGH (ref 70–99)
Potassium: 5.2 mmol/L (ref 3.5–5.2)
Sodium: 142 mmol/L (ref 134–144)
eGFR: 83 mL/min/{1.73_m2} (ref >59)

## 2023-11-19 ENCOUNTER — Telehealth: Payer: Self-pay | Admitting: Emergency Medicine

## 2023-11-19 NOTE — Telephone Encounter (Signed)
 Left message for patient to call back

## 2023-11-19 NOTE — Telephone Encounter (Signed)
 Called and spoke to patient. Reviewed lab results with patient as per Dr. Madireddy's note. He verbalized understanding and had no further questions.

## 2023-11-19 NOTE — Telephone Encounter (Signed)
-----   Message from [place] R Madireddy sent at 11/18/2023  4:06 PM EDT ----- Please let them know the blood work results show normal electrolytes and kidney function parameters.

## 2023-11-23 DIAGNOSIS — R001 Bradycardia, unspecified: Secondary | ICD-10-CM | POA: Insufficient documentation

## 2023-11-23 DIAGNOSIS — M549 Dorsalgia, unspecified: Secondary | ICD-10-CM | POA: Insufficient documentation

## 2023-11-26 ENCOUNTER — Other Ambulatory Visit: Payer: Self-pay | Admitting: Cardiology

## 2023-11-26 NOTE — Telephone Encounter (Signed)
 Prescription sent to pharmacy.

## 2023-12-10 ENCOUNTER — Ambulatory Visit

## 2023-12-10 DIAGNOSIS — I471 Supraventricular tachycardia, unspecified: Secondary | ICD-10-CM

## 2023-12-10 DIAGNOSIS — I10 Essential (primary) hypertension: Secondary | ICD-10-CM

## 2023-12-10 LAB — ECHOCARDIOGRAM COMPLETE
Calc EF: 56.2 %
S' Lateral: 3.9 cm
Single Plane A2C EF: 60.7 %
Single Plane A4C EF: 50.4 %

## 2023-12-31 DIAGNOSIS — R634 Abnormal weight loss: Secondary | ICD-10-CM | POA: Insufficient documentation

## 2024-01-25 ENCOUNTER — Other Ambulatory Visit: Payer: Self-pay | Admitting: Cardiology

## 2024-01-30 ENCOUNTER — Other Ambulatory Visit: Payer: Self-pay | Admitting: Cardiology

## 2024-05-05 DIAGNOSIS — Z5321 Procedure and treatment not carried out due to patient leaving prior to being seen by health care provider: Secondary | ICD-10-CM | POA: Insufficient documentation

## 2024-05-05 NOTE — Progress Notes (Unsigned)
 Cardiology Office Note:    Date:  05/06/2024   ID:  Charles Gonzalez, DOB July 11, 1951, MRN [id number]  PCP:  [facility]  Cardiologist:  Redell Leiter, MD    Referring MD: [facility]    ASSESSMENT:    1. Coronary artery disease of native artery of native heart with stable angina pectoris   2. PSVT (paroxysmal supraventricular tachycardia)   3. Pacemaker   4. PAD (peripheral artery disease)   5. Hypertensive heart disease, unspecified whether heart failure present   6. Mixed hyperlipidemia    PLAN:    In order of problems listed above:  He is having symptoms quite anginal equivalent to be set up for myocardial perfusion study in my office if abnormal I would favor referral to coronary angiography With his combined CAD PAD will continue his long-term dual platelet therapy currently not on lipid-lowering statin intolerant No recurrent SVT Stable pacemaker from our device clinic Hypertension well-controlled continue lisinopril    Next appointment: 6 months   Medication Adjustments/Labs and Tests Ordered: Current medicines are reviewed at length with the patient today.  Concerns regarding medicines are outlined above.  No orders of the defined types were placed in this encounter.  No orders of the defined types were placed in this encounter.    History of Present Illness:    Charles Gonzalez is a 73 y.o. male with a hx of CAD with PCI of the right coronary artery 1990 SVT with EP catheter ablation 2017 right bundle branch block dyslipidemia and statin intolerance hypertensive heart disease peripheral vascular disease with PCI left common iliac artery permanent pacemaker for symptomatic bradycardia followed by device clinic [facility] last seen 05/08/2023.  Compliance with diet, lifestyle and medications: Yes  He is seen at the [facility] yesterday EKG showed sinus rhythm right bundle branch block He has a history of CAD and has developed marked  exercise intolerance light kitchen activities walking to the mailbox he has to stop pause takes a moment to pull himself together short of breath no chest pain but quite anginal equivalent He is not having wheezing orthopnea edema palpitation or syncope He tells me had a chest x-ray at the [facility] was told it was normal and to see cardiology And initially I wanted to do a cardiac CTA but he had a previous PCI Past Medical History:  Diagnosis Date   Acute kidney failure with acute cortical necrosis 04/25/2017   Arthritis 04/25/2017   Benign hypertension 10/06/2016   C. difficile colitis 04/25/2017   02/2016   CAD (coronary artery disease), native coronary artery 04/19/2015   Overview:  PCI of RCA 1995   Cirrhosis (HCC) 04/25/2017   Essential hypertension 04/25/2017   Gastric ulcer 04/25/2017   High risk medication use 03/01/2016   Overview:  sotolol   Hyperlipidemia 04/19/2015   Hypertension 04/25/2017   Mixed hyperlipidemia 10/06/2016   Overview:  HIS indicated based on CHD, declines statin, felt bad, declines [id number]   Obesity 04/25/2017   Old MI (myocardial infarction) 04/03/1994   Pacemaker 06/08/2015   Overview:  St Jude   Pacemaker reprogramming/check 06/28/2015   PAD (peripheral artery disease) 04/19/2015   Overview:  PCI of L CIA 2001   Polyneuropathy in diabetes (HCC) 04/25/2017   Presence of permanent cardiac pacemaker 06/14/2015   Procedure and treatment not carried out due to patient leaving prior to being seen by health care provider 05/05/2024   PSVT (paroxysmal supraventricular tachycardia) 04/19/2015   S/P ablation of accessory bypass  tract 06/09/2015   S/P coronary artery stent placement 04/25/2017   Overview:  Multiple   Shortness of breath 04/25/2017   Overview:  exertional and with tachycardia   Sinus bradycardia 11/23/2023   Transient complete heart block (HCC) 06/28/2015   Overview:  pacemaker   Type 2 diabetes mellitus without complication, without  long-term current use of insulin (HCC) 05/02/2015   Overview:  1998: dx   Unintended weight loss 12/31/2023   Upper back pain 11/23/2023    Current Medications: Current Meds  Medication Sig   aspirin EC 81 MG tablet Take 81 mg by mouth daily.   clopidogrel  (PLAVIX ) 75 MG tablet TAKE ONE TABLET BY MOUTH EVERY DAY   colchicine 0.6 MG tablet Take 0.6 mg by mouth as needed (for gout flare ups).   lisinopril  (ZESTRIL ) 10 MG tablet TAKE ONE TABLET BY MOUTH EVERY DAY   metFORMIN (GLUCOPHAGE) 500 MG tablet Take 500 mg by mouth daily with breakfast.   Multiple Vitamins-Minerals (PRESERVISION AREDS 2+MULTI VIT) CAPS Take 1 capsule by mouth 2 (two) times daily.   nitroGLYCERIN  (NITROSTAT ) 0.4 MG SL tablet Place 1 tablet (0.4 mg total) under the tongue every 5 (five) minutes as needed for chest pain.   sotalol  (BETAPACE ) 80 MG tablet TAKE ONE TABLET BY MOUTH TWICE DAILY      EKGs/Labs/Other Studies Reviewed:    The following studies were reviewed today:  Cardiac Studies & Procedures   ______________________________________________________________________________________________   STRESS TESTS  MYOCARDIAL PERFUSION IMAGING 09/14/2020  Interpretation Summary  The left ventricular ejection fraction is normal (55-65%).  Nuclear stress EF: 60%.  There was no ST segment deviation noted during stress.  Defect 1: There is a medium defect of moderate severity present in the basal inferior, mid inferior and apical inferior location.  The study is normal.  This is a low risk study.   ECHOCARDIOGRAM  ECHOCARDIOGRAM COMPLETE 12/10/2023  Narrative ECHOCARDIOGRAM REPORT    Patient Name:   Charles Gonzalez Date of Exam: 12/10/2023 Medical Rec #:  [id number]    Height:       73.0 in Accession #:    [id number]   Weight:       208.4 lb Date of Birth:  1951-03-14     BSA:          2.190 m Patient Age:    73 years     BP:           132/60 mmHg Patient Gender: M            HR:           60 bpm. Exam  Location:  [facility]  Procedure: 2D Echo, Color Doppler and Cardiac Doppler (Both Spectral and Color Flow Doppler were utilized during procedure).  Indications:    Benign hypertension [I10]  History:        Patient has no prior history of Echocardiogram examinations. Previous Myocardial Infarction and CAD, Pacemaker; Risk Factors:Diabetes and Dyslipidemia.  Sonographer:    Lynwood Silvas RDCS Referring Phys: [id number] ALEAN SAUNDERS MADIREDDY  IMPRESSIONS   1. Left ventricular ejection fraction, by estimation, is 50 to 55%. The left ventricle has low normal function. The left ventricle has no regional wall motion abnormalities. Left ventricular diastolic parameters are indeterminate. The average left ventricular global longitudinal strain is -16.4 %. The global longitudinal strain is abnormal. 2. Right ventricular systolic function is normal. The right ventricular size is normal. There is normal pulmonary artery systolic pressure. 3. The mitral valve is normal  in structure. Mild mitral valve regurgitation. No evidence of mitral stenosis. 4. The aortic valve is normal in structure. Aortic valve regurgitation is not visualized. Aortic valve sclerosis is present, with no evidence of aortic valve stenosis. 5. The inferior vena cava is normal in size with greater than 50% respiratory variability, suggesting right atrial pressure of 3 mmHg.  FINDINGS Left Ventricle: Left ventricular ejection fraction, by estimation, is 50 to 55%. The left ventricle has low normal function. The left ventricle has no regional wall motion abnormalities. The average left ventricular global longitudinal strain is -16.4 %. Strain was performed and the global longitudinal strain is abnormal. The left ventricular internal cavity size was normal in size. There is borderline left ventricular hypertrophy. Left ventricular diastolic parameters are indeterminate.  Right Ventricle: The right ventricular size is normal. No increase in  right ventricular wall thickness. Right ventricular systolic function is normal. There is normal pulmonary artery systolic pressure. The tricuspid regurgitant velocity is 1.61 m/s, and with an assumed right atrial pressure of 3 mmHg, the estimated right ventricular systolic pressure is 13.4 mmHg.  Left Atrium: Left atrial size was normal in size.  Right Atrium: Right atrial size was normal in size.  Pericardium: There is no evidence of pericardial effusion.  Mitral Valve: The mitral valve is normal in structure. Mild mitral valve regurgitation. No evidence of mitral valve stenosis.  Tricuspid Valve: The tricuspid valve is normal in structure. Tricuspid valve regurgitation is trivial. No evidence of tricuspid stenosis.  Aortic Valve: The aortic valve is normal in structure. Aortic valve regurgitation is not visualized. Aortic valve sclerosis is present, with no evidence of aortic valve stenosis.  Pulmonic Valve: The pulmonic valve was normal in structure. Pulmonic valve regurgitation is not visualized. No evidence of pulmonic stenosis.  Aorta: The aortic root is normal in size and structure.  Venous: The inferior vena cava is normal in size with greater than 50% respiratory variability, suggesting right atrial pressure of 3 mmHg.  IAS/Shunts: No atrial level shunt detected by color flow Doppler.  Additional Comments: A device lead is visualized in the right ventricle and right atrium.   LEFT VENTRICLE PLAX 2D LVIDd:         5.20 cm     Diastology LVIDs:         3.90 cm     LV e' medial:    6.20 cm/s LV PW:         1.10 cm     LV E/e' medial:  12.6 LV IVS:        1.10 cm     LV e' lateral:   8.38 cm/s LVOT diam:     2.20 cm     LV E/e' lateral: 9.3 LV SV:         66 LV SV Index:   30          2D Longitudinal Strain LVOT Area:     3.80 cm    2D Strain GLS Avg:     -16.4 %  LV Volumes (MOD) LV vol d, MOD A2C: 94.8 ml LV vol d, MOD A4C: 78.0 ml LV vol s, MOD A2C: 37.3 ml LV vol  s, MOD A4C: 38.7 ml LV SV MOD A2C:     57.5 ml LV SV MOD A4C:     78.0 ml LV SV MOD BP:      49.0 ml  RIGHT VENTRICLE             IVC RV  Basal diam:  2.60 cm     IVC diam: 1.30 cm RV S prime:     11.10 cm/s TAPSE (M-mode): 2.4 cm  LEFT ATRIUM             Index        RIGHT ATRIUM           Index LA diam:        3.60 cm 1.64 cm/m   RA Area:     12.50 cm LA Vol (A2C):   56.5 ml 25.80 ml/m  RA Volume:   29.60 ml  13.52 ml/m LA Vol (A4C):   57.9 ml 26.44 ml/m LA Biplane Vol: 56.3 ml 25.71 ml/m AORTIC VALVE LVOT Vmax:   70.20 cm/s LVOT Vmean:  45.600 cm/s LVOT VTI:    0.174 m  AORTA Ao Root diam: 3.50 cm Ao Asc diam:  3.40 cm Ao Desc diam: 2.40 cm  MV E velocity: 78.10 cm/s  TRICUSPID VALVE MV A velocity: 89.10 cm/s  TR Peak grad:   10.4 mmHg MV E/A ratio:  0.88        TR Vmax:        161.00 cm/s  SHUNTS Systemic VTI:  0.17 m Systemic Diam: 2.20 cm  Lamar Fitch MD Electronically signed by Lamar Fitch MD Signature Date/Time: 12/10/2023/5:12:36 PM    Final          ______________________________________________________________________________________________          Recent Labs: 11/13/2023: BUN 13; Creatinine, Ser 0.97; Potassium 5.2; Sodium 142  Recent Lipid Panel    Component Value Date/Time   CHOL 194 06/18/2017 1000   TRIG 241 (H) 06/18/2017 1000   HDL 37 (L) 06/18/2017 1000   CHOLHDL 5.2 (H) 06/18/2017 1000   LDLCALC 109 (H) 06/18/2017 1000    Physical Exam:    VS:  BP (!) 138/54   Pulse 64   Ht 6' (1.829 m)   Wt 201 lb 12.8 oz (91.5 kg)   SpO2 98%   BMI 27.37 kg/m     Wt Readings from Last 3 Encounters:  05/06/24 201 lb 12.8 oz (91.5 kg)  11/13/23 208 lb 6.4 oz (94.5 kg)  05/08/23 213 lb 3.2 oz (96.7 kg)     GEN:  Well nourished, well developed in no acute distress HEENT: Normal NECK: No JVD; No carotid bruits LYMPHATICS: No lymphadenopathy CARDIAC: RRR, no murmurs, rubs, gallops RESPIRATORY:  Clear to auscultation  without rales, wheezing or rhonchi  ABDOMEN: Soft, non-tender, non-distended MUSCULOSKELETAL:  No edema; No deformity  SKIN: Warm and dry NEUROLOGIC:  Alert and oriented x 3 PSYCHIATRIC:  Normal affect    Signed, Redell Leiter, MD  05/06/2024 3:30 PM    [facility]

## 2024-05-06 ENCOUNTER — Encounter: Payer: Self-pay | Admitting: Cardiology

## 2024-05-06 ENCOUNTER — Ambulatory Visit: Attending: Cardiology | Admitting: Cardiology

## 2024-05-06 VITALS — BP 138/54 | HR 64 | Ht 72.0 in | Wt 201.8 lb

## 2024-05-06 DIAGNOSIS — I471 Supraventricular tachycardia, unspecified: Secondary | ICD-10-CM | POA: Diagnosis not present

## 2024-05-06 DIAGNOSIS — I25118 Atherosclerotic heart disease of native coronary artery with other forms of angina pectoris: Secondary | ICD-10-CM | POA: Diagnosis not present

## 2024-05-06 DIAGNOSIS — R053 Chronic cough: Secondary | ICD-10-CM | POA: Insufficient documentation

## 2024-05-06 DIAGNOSIS — I119 Hypertensive heart disease without heart failure: Secondary | ICD-10-CM

## 2024-05-06 DIAGNOSIS — I739 Peripheral vascular disease, unspecified: Secondary | ICD-10-CM

## 2024-05-06 DIAGNOSIS — E782 Mixed hyperlipidemia: Secondary | ICD-10-CM

## 2024-05-06 DIAGNOSIS — Z950 Presence of cardiac pacemaker: Secondary | ICD-10-CM

## 2024-05-06 NOTE — Patient Instructions (Signed)
 Medication Instructions:  Your physician recommends that you continue on your current medications as directed. Please refer to the Current Medication list given to you today.  *If you need a refill on your cardiac medications before your next appointment, please call your pharmacy*  Lab Work: Your physician recommends that you return for lab work in:   Labs today: Pro BNP  If you have labs (blood work) drawn today and your tests are completely normal, you will receive your results only by: MyChart Message (if you have MyChart) OR A paper copy in the mail If you have any lab test that is abnormal or we need to change your treatment, we will call you to review the results.  Testing/Procedures:   [facility] [address] [place], [place] [address] Phone:  [phone]    Please arrive 15 minutes prior to your appointment time for registration and insurance purposes.  The test will take approximately 3 to 4 hours to complete; you may bring reading material.  If someone comes with you to your appointment, they will need to remain in the main lobby due to limited space in the testing area. **If you are pregnant or breastfeeding, please notify the nuclear lab prior to your appointment**  How to prepare for your Myocardial Perfusion Test: Do not eat or drink 3 hours prior to your test, except you may have water. Do not consume products containing caffeine (regular or decaffeinated) 12 hours prior to your test. (ex: coffee, chocolate, sodas, tea). Do bring a list of your current medications with you.  If not listed below, you may take your medications as normal. Do not take sotolol (Betapace ) for 24 hours prior to the test.  Bring the medication to your appointment as you may be required to take it once the test is complete. HOLD diabetic medication/insulin the morning of the test: Metformin Do wear comfortable clothes (no dresses or overalls) and walking shoes,  tennis shoes preferred (No heels or open toe shoes are allowed). Do NOT wear cologne, perfume, aftershave, or lotions (deodorant is allowed). If these instructions are not followed, your test will have to be rescheduled.  Please report to [address] for your test.  If you have questions or concerns about your appointment, you can call the [facility] at [phone].  If you cannot keep your appointment, please provide 24 hours notification to the Nuclear Lab, to avoid a possible $50 charge to your account.   Follow-Up: At [facility], you and your health needs are our priority.  As part of our continuing mission to provide you with exceptional heart care, our providers are all part of one team.  This team includes your primary Cardiologist (physician) and Advanced Practice Providers or APPs (Physician Assistants and Nurse Practitioners) who all work together to provide you with the care you need, when you need it.  Your next appointment:   6 month(s)  Provider:   Redell Leiter, MD    We recommend signing up for the patient portal called MyChart.  Sign up information is provided on this After Visit Summary.  MyChart is used to connect with patients for Virtual Visits (Telemedicine).  Patients are able to view lab/test results, encounter notes, upcoming appointments, etc.  Non-urgent messages can be sent to your provider as well.   To learn more about what you can do with MyChart, go to [email or web address].   Other Instructions None

## 2024-05-06 NOTE — Addendum Note (Signed)
 Addended by: SHERRE ADE I on: 05/06/2024 04:06 PM   Modules accepted: Orders

## 2024-05-06 NOTE — Addendum Note (Signed)
 Addended by: SHERRE ADE I on: 05/06/2024 04:41 PM   Modules accepted: Orders

## 2024-05-07 ENCOUNTER — Ambulatory Visit: Payer: Self-pay

## 2024-05-07 DIAGNOSIS — R7989 Other specified abnormal findings of blood chemistry: Secondary | ICD-10-CM

## 2024-05-07 LAB — PRO B NATRIURETIC PEPTIDE: NT-Pro BNP: 547 pg/mL — ABNORMAL HIGH (ref 0–376)

## 2024-05-08 NOTE — Telephone Encounter (Signed)
Results reviewed with pt as per Dr. Munley's note.  Pt verbalized understanding and had no additional questions. Routed to PCP  

## 2024-05-08 NOTE — Telephone Encounter (Signed)
-----   Message from [place] sent at 05/07/2024  9:58 AM EDT ----- BNP is moderately elevated I do also like him to have an echocardiogram in our office with his shortness of breath. ----- Message ----- From: Interface, [facility] Lab Results In Sent: 05/07/2024   7:38 AM EDT To: Redell JINNY Leiter, MD

## 2024-05-08 NOTE — Addendum Note (Signed)
 Addended by: ONEITA BERLINER on: 05/08/2024 09:07 AM   Modules accepted: Orders

## 2024-05-13 ENCOUNTER — Encounter (HOSPITAL_COMMUNITY): Payer: Self-pay | Admitting: *Deleted

## 2024-05-27 ENCOUNTER — Ambulatory Visit: Attending: Cardiology

## 2024-05-27 DIAGNOSIS — I739 Peripheral vascular disease, unspecified: Secondary | ICD-10-CM

## 2024-05-27 DIAGNOSIS — I25118 Atherosclerotic heart disease of native coronary artery with other forms of angina pectoris: Secondary | ICD-10-CM

## 2024-05-27 DIAGNOSIS — Z950 Presence of cardiac pacemaker: Secondary | ICD-10-CM

## 2024-05-27 DIAGNOSIS — E782 Mixed hyperlipidemia: Secondary | ICD-10-CM

## 2024-05-27 DIAGNOSIS — I119 Hypertensive heart disease without heart failure: Secondary | ICD-10-CM

## 2024-05-27 DIAGNOSIS — I471 Supraventricular tachycardia, unspecified: Secondary | ICD-10-CM | POA: Diagnosis not present

## 2024-05-27 LAB — MYOCARDIAL PERFUSION IMAGING
LV dias vol: 131 mL (ref 62–150)
LV sys vol: 64 mL (ref 4.2–5.8)
Nuc Stress EF: 51 %
Peak HR: 79 {beats}/min
Rest HR: 62 {beats}/min
Rest Nuclear Isotope Dose: 11 mCi
SDS: 3
SRS: 22
SSS: 25
Stress Nuclear Isotope Dose: 30.5 mCi
TID: 1.09

## 2024-05-27 MED ORDER — TECHNETIUM TC 99M TETROFOSMIN IV KIT
11.0000 | PACK | Freq: Once | INTRAVENOUS | Status: AC | PRN
  Administered 2024-05-27: 11 via INTRAVENOUS

## 2024-05-27 MED ORDER — TECHNETIUM TC 99M TETROFOSMIN IV KIT
30.5000 | PACK | Freq: Once | INTRAVENOUS | Status: AC | PRN
  Administered 2024-05-27: 30.5 via INTRAVENOUS

## 2024-05-27 MED ORDER — REGADENOSON 0.4 MG/5ML IV SOLN
0.4000 mg | Freq: Once | INTRAVENOUS | Status: AC
  Administered 2024-05-27: 0.4 mg via INTRAVENOUS

## 2024-05-28 ENCOUNTER — Ambulatory Visit: Attending: Cardiology

## 2024-05-28 DIAGNOSIS — R7989 Other specified abnormal findings of blood chemistry: Secondary | ICD-10-CM | POA: Diagnosis not present

## 2024-05-28 LAB — ECHOCARDIOGRAM COMPLETE
AR max vel: 2.27 cm2
AV Area VTI: 2.39 cm2
AV Area mean vel: 2.28 cm2
AV Mean grad: 3.7 mmHg
AV Peak grad: 6.8 mmHg
Ao pk vel: 1.3 m/s
Area-P 1/2: 2.31 cm2
MV M vel: 2.68 m/s
MV Peak grad: 28.7 mmHg
MV VTI: 1.43 cm2
S' Lateral: 3.9 cm

## 2024-05-29 ENCOUNTER — Ambulatory Visit: Payer: Self-pay | Admitting: Cardiology

## 2024-05-29 DIAGNOSIS — R0602 Shortness of breath: Secondary | ICD-10-CM

## 2024-05-30 NOTE — Telephone Encounter (Signed)
Results reviewed with pt as per Dr. Munley's note.  Pt verbalized understanding and had no additional questions. Routed to PCP  

## 2024-05-31 ENCOUNTER — Ambulatory Visit (INDEPENDENT_AMBULATORY_CARE_PROVIDER_SITE_OTHER)
Admission: RE | Admit: 2024-05-31 | Discharge: 2024-05-31 | Disposition: A | Source: Ambulatory Visit | Attending: Cardiology | Admitting: Cardiology

## 2024-05-31 DIAGNOSIS — R0602 Shortness of breath: Secondary | ICD-10-CM | POA: Diagnosis not present

## 2024-06-02 ENCOUNTER — Ambulatory Visit: Payer: Self-pay | Admitting: Cardiology

## 2024-06-19 ENCOUNTER — Ambulatory Visit: Admitting: Cardiology

## 2024-06-22 NOTE — Progress Notes (Unsigned)
 Cardiology Office Note:    Date:  06/23/2024   ID:  Charles Gonzalez, DOB 06-25-1951, MRN [id number]  PCP:  [facility]  Cardiologist:  Charles Leiter, MD    Referring MD: [facility]    ASSESSMENT:    1. Coronary artery disease of native artery of native heart with stable angina pectoris   2. PSVT (paroxysmal supraventricular tachycardia)   3. PAD (peripheral artery disease)   4. Hypertensive heart disease, unspecified whether heart failure present   5. Mixed hyperlipidemia   6. Pacemaker    PLAN:    In order of problems listed above:  His clinical problem is background of CAD remote PCI exercise intolerance exertional shortness of breath that I think is most consistent with anginal equivalent but I understand that he wants to pursue pulmonary prior to further cardiac evaluation I will give him a copy of this note to handcarried to the [facility] he thinks that they can arrange full PFTs with diffusion capacity and high-resolution CT of the chest.  For now continue aspirin beta-blocker and he is not on lipid-lowering but he has choice If the studies are not diagnostic of a primary lung problem I think he should undergo a left and right heart catheterization SVT is suppressed he continues sotalol  Stable PAD having no claudication after remote PCI Hypertension is well-controlled ACE inhibitor Stable pacemaker function he is followed with the [facility]   Next appointment: 6 months with me   Medication Adjustments/Labs and Tests Ordered: Current medicines are reviewed at length with the patient today.  Concerns regarding medicines are outlined above.  Orders Placed This Encounter  Procedures   EKG 12-Lead   No orders of the defined types were placed in this encounter.    History of Present Illness:    Charles Gonzalez is a 72 y.o. male with a hx of CAD with PCI of the right coronary artery in 1990 SVT with EP catheter ablation 2017 right bundle branch  block dyslipidemia with statin intolerance hypertensive heart disease peripheral vascular disease with PCI left common iliac artery many years ago pacemaker for symptomatic bradycardia followed through device clinic [facility] last seen 05/06/2024.  His wife was seen last week by me relates he was having frequent chest pain and wanted to be seen back in the office.  He had a myocardial perfusion study reported 05/27/2024 showed an ejection fraction mildly reduced to low normal at 51% and normal myocardial perfusion. BNP level was mid range 547 Echocardiogram showed moderate LVH grade 1 diastolic dysfunction EF 55 to 39% with normal GLS mitral regurgitation.  I reviewed the echo report left ventricular diastolic filling pressure is normal.  Compliance with diet, lifestyle and medications: Yes  [name] seen back in the office again today he has a progressive pattern of exertional shortness of breath some of this sounds anginal in nature he has to stop and rest to recover at times he also had 1 episode of severe anginal discomfort using his upper extremities at home He is not having edema orthopnea not having frequent exertional chest pain His studies so far do not show a large ischemic burden on imaging LV function is normal filling pressures look normal does not have pulmonary hypertension He is concerned that he has a lung injury from his service in [place] he wants to explore that and he is going to speak to his physician about full PFTs with diffusion capacity and high-resolution CT of the chest If these are  unrevealing I think he should undergo a left and right heart catheterization Past Medical History:  Diagnosis Date   Acute kidney failure with acute cortical necrosis 04/25/2017   Arthritis 04/25/2017   Benign hypertension 10/06/2016   C. difficile colitis 04/25/2017   02/2016   CAD (coronary artery disease), native coronary artery 04/19/2015   Overview:  PCI of RCA 1995    Cirrhosis (HCC) 04/25/2017   Essential hypertension 04/25/2017   Gastric ulcer 04/25/2017   High risk medication use 03/01/2016   Overview:  sotolol   Hyperlipidemia 04/19/2015   Hypertension 04/25/2017   Mixed hyperlipidemia 10/06/2016   Overview:  HIS indicated based on CHD, declines statin, felt bad, declines [id number]   Obesity 04/25/2017   Old MI (myocardial infarction) 04/03/1994   Pacemaker 06/08/2015   Overview:  St Jude   Pacemaker reprogramming/check 06/28/2015   PAD (peripheral artery disease) 04/19/2015   Overview:  PCI of L CIA 2001   Polyneuropathy in diabetes (HCC) 04/25/2017   Presence of permanent cardiac pacemaker 06/14/2015   Procedure and treatment not carried out due to patient leaving prior to being seen by health care provider 05/05/2024   PSVT (paroxysmal supraventricular tachycardia) 04/19/2015   S/P ablation of accessory bypass tract 06/09/2015   S/P coronary artery stent placement 04/25/2017   Overview:  Multiple   Shortness of breath 04/25/2017   Overview:  exertional and with tachycardia   Sinus bradycardia 11/23/2023   Transient complete heart block (HCC) 06/28/2015   Overview:  pacemaker   Type 2 diabetes mellitus without complication, without long-term current use of insulin (HCC) 05/02/2015   Overview:  1998: dx   Unintended weight loss 12/31/2023   Upper back pain 11/23/2023    Current Medications: Current Meds  Medication Sig   aspirin EC 81 MG tablet Take 81 mg by mouth daily.   clopidogrel  (PLAVIX ) 75 MG tablet TAKE ONE TABLET BY MOUTH EVERY DAY   colchicine 0.6 MG tablet Take 0.6 mg by mouth as needed (for gout flare ups).   lisinopril  (ZESTRIL ) 10 MG tablet TAKE ONE TABLET BY MOUTH EVERY DAY   metFORMIN (GLUCOPHAGE) 500 MG tablet Take 500 mg by mouth daily with breakfast.   Multiple Vitamins-Minerals (PRESERVISION AREDS 2+MULTI VIT) CAPS Take 1 capsule by mouth 2 (two) times daily.   nitroGLYCERIN  (NITROSTAT ) 0.4 MG SL tablet Place 1  tablet (0.4 mg total) under the tongue every 5 (five) minutes as needed for chest pain.   sotalol  (BETAPACE ) 80 MG tablet TAKE ONE TABLET BY MOUTH TWICE DAILY      EKGs/Labs/Other Studies Reviewed:    The following studies were reviewed today:  Cardiac Studies & Procedures   ______________________________________________________________________________________________   STRESS TESTS  MYOCARDIAL PERFUSION IMAGING 05/27/2024  Interpretation Summary   Findings are consistent with no ischemia and no infarction. The study is intermediate risk secondary to mildly diminished EF.   Left ventricular function is abnormal. Global function is mildly reduced. Nuclear stress EF: 51%. The left ventricular ejection fraction is mildly decreased (45-54%). End diastolic cavity size is normal.   Prior study available for comparison from 09/14/2020.   ECHOCARDIOGRAM  ECHOCARDIOGRAM COMPLETE 05/28/2024  Narrative ECHOCARDIOGRAM REPORT    Patient Name:   Charles Gonzalez Date of Exam: 05/28/2024 Medical Rec #:  [id number]    Height:       72.0 in Accession #:    [id number]   Weight:       201.0 lb Date of Birth:  May 19, 1951  BSA:          2.135 m Patient Age:    73 years     BP:           138/54 mmHg Patient Gender: M            HR:           62 bpm. Exam Location:  [facility]  Procedure: 2D Echo, Cardiac Doppler, Color Doppler and Strain Analysis (Both Spectral and Color Flow Doppler were utilized during procedure).  Indications:    Elevated brain natriuretic peptide (BNP) level [R79.89 (ICD-10-CM)]  History:        Patient has prior history of Echocardiogram examinations, most recent 12/10/2023. Previous Myocardial Infarction and CAD, Pacemaker, PAD, Arrythmias:Tachycardia and Bradycardia, Signs/Symptoms:Shortness of Breath; Risk Factors:Hypertension, Dyslipidemia and Diabetes. Prior echo- 50-55% EF.  Sonographer:    Saddie Chimes Referring Phys: [phone] Shameer Molstad J Treyshawn Muldrew  IMPRESSIONS   1.  Left ventricular ejection fraction, by estimation, is 55 to 60%. The left ventricle has normal function. The left ventricle demonstrates regional wall motion abnormalities (see scoring diagram/findings for description). There is moderate concentric left ventricular hypertrophy. Left ventricular diastolic parameters are consistent with Grade I diastolic dysfunction (impaired relaxation). The average left ventricular global longitudinal strain is 19.5 %. The global longitudinal strain is normal. 2. Right ventricular systolic function is normal. The right ventricular size is mildly enlarged. There is normal pulmonary artery systolic pressure. 3. The mitral valve is degenerative. Mild mitral valve regurgitation. No evidence of mitral stenosis. 4. The aortic valve is tricuspid. Aortic valve regurgitation is not visualized. Aortic valve sclerosis is present, with no evidence of aortic valve stenosis. 5. The inferior vena cava is normal in size with greater than 50% respiratory variability, suggesting right atrial pressure of 3 mmHg.  FINDINGS Left Ventricle: Left ventricular ejection fraction, by estimation, is 55 to 60%. The left ventricle has normal function. The left ventricle demonstrates regional wall motion abnormalities. The average left ventricular global longitudinal strain is 19.5 %. Strain was performed and the global longitudinal strain is normal. The left ventricular internal cavity size was normal in size. There is moderate concentric left ventricular hypertrophy. Left ventricular diastolic parameters are consistent with Grade I diastolic dysfunction (impaired relaxation). Normal left ventricular filling pressure.   LV Wall Scoring: The basal inferior segment is hypokinetic. The entire anterior wall, entire lateral wall, entire septum, entire apex, and mid and distal inferior wall are normal.  Right Ventricle: The right ventricular size is mildly enlarged. No increase in right ventricular  wall thickness. Right ventricular systolic function is normal. There is normal pulmonary artery systolic pressure. The tricuspid regurgitant velocity is 2.41 m/s, and with an assumed right atrial pressure of 3 mmHg, the estimated right ventricular systolic pressure is 26.2 mmHg.  Left [facility]: Left atrial size was normal in size.  Right [facility]: Right atrial size was normal in size.  Pericardium: There is no evidence of pericardial effusion.  Mitral Valve: The mitral valve is degenerative in appearance. Mild mitral annular calcification. Mild mitral valve regurgitation. No evidence of mitral valve stenosis. MV peak gradient, 4.2 mmHg. The mean mitral valve gradient is 2.0 mmHg.  Tricuspid Valve: The tricuspid valve is normal in structure. Tricuspid valve regurgitation is mild . No evidence of tricuspid stenosis.  Aortic Valve: The aortic valve is tricuspid. Aortic valve regurgitation is not visualized. Aortic valve sclerosis is present, with no evidence of aortic valve stenosis. Aortic valve mean gradient measures 3.7 mmHg. Aortic valve  peak gradient measures 6.8 mmHg. Aortic valve area, by VTI measures 2.39 cm.  Pulmonic Valve: The pulmonic valve was normal in structure. Pulmonic valve regurgitation is not visualized. No evidence of pulmonic stenosis.  Aorta: The aortic root is normal in size and structure, the ascending aorta was not well visualized and the aortic arch was not well visualized.  Venous: The pulmonary veins were not well visualized. The inferior vena cava is normal in size with greater than 50% respiratory variability, suggesting right atrial pressure of 3 mmHg.  IAS/Shunts: No atrial level shunt detected by color flow Doppler.  Additional Comments: A device lead is visualized.   LEFT VENTRICLE PLAX 2D LVIDd:         5.40 cm   Diastology LVIDs:         3.90 cm   LV e' medial:    7.51 cm/s LV PW:         1.30 cm   LV E/e' medial:  9.3 LV IVS:        1.60 cm   LV e'  lateral:   8.49 cm/s LVOT diam:     2.40 cm   LV E/e' lateral: 8.3 LV SV:         79 LV SV Index:   37        2D Longitudinal Strain LVOT Area:     4.52 cm  2D Strain GLS Avg:     19.5 %   RIGHT VENTRICLE RV Basal diam:  4.60 cm RV Mid diam:    3.50 cm TAPSE (M-mode): 2.6 cm  LEFT ATRIUM           Index LA diam:      3.60 cm 1.69 cm/m LA Vol (A4C): 35.8 ml 16.77 ml/m AORTIC VALVE                    PULMONIC VALVE AV Area (Vmax):    2.27 cm     PV Vmax:       0.75 m/s AV Area (Vmean):   2.28 cm     PV Vmean:      55.000 cm/s AV Area (VTI):     2.39 cm     PV VTI:        0.196 m AV Vmax:           130.33 cm/s  PV Peak grad:  2.2 mmHg AV Vmean:          88.067 cm/s  PV Mean grad:  1.0 mmHg AV VTI:            0.331 m AV Peak Grad:      6.8 mmHg AV Mean Grad:      3.7 mmHg LVOT Vmax:         65.45 cm/s LVOT Vmean:        44.450 cm/s LVOT VTI:          0.174 m LVOT/AV VTI ratio: 0.53  AORTA Ao Asc diam: 3.20 cm  MITRAL VALVE                TRICUSPID VALVE MV Area (PHT): 2.31 cm     TR Peak grad:   23.2 mmHg MV Area VTI:   1.43 cm     TR Vmax:        241.00 cm/s MV Peak grad:  4.2 mmHg MV Mean grad:  2.0 mmHg     SHUNTS MV Vmax:       1.02  m/s     Systemic VTI:  0.17 m MV Vmean:      60.9 cm/s    Systemic Diam: 2.40 cm MV Decel Time: 329 msec MR Peak grad: 28.7 mmHg MR Vmax:      268.00 cm/s MV E velocity: 70.20 cm/s MV A velocity: 102.00 cm/s MV E/A ratio:  0.69  Charles Leiter MD Electronically signed by Charles Leiter MD Signature Date/Time: 05/28/2024/4:49:48 PM    Final          ______________________________________________________________________________________________      EKG Interpretation Date/Time:  Monday June 23 2024 07:45:02 EST Ventricular Rate:  61 PR Interval:  190 QRS Duration:  154 QT Interval:  454 QTC Calculation: 457 R Axis:   56  Text Interpretation: Normal sinus rhythm Right bundle branch block Possible Inferior infarct  , age undetermined When compared with ECG of 13-Nov-2023 13:15, Premature ventricular complexes are no longer Present Confirmed by Gonzalez Charles ([id number]) on 06/23/2024 7:48:40 AM   Recent Labs: 11/13/2023: BUN 13; Creatinine, Ser 0.97; Potassium 5.2; Sodium 142 05/06/2024: NT-Pro BNP 547  Recent Lipid Panel    Component Value Date/Time   CHOL 194 06/18/2017 1000   TRIG 241 (H) 06/18/2017 1000   HDL 37 (L) 06/18/2017 1000   CHOLHDL 5.2 (H) 06/18/2017 1000   LDLCALC 109 (H) 06/18/2017 1000    Physical Exam:    VS:  BP 130/60   Pulse 61   Ht 6' (1.829 m)   Wt 200 lb (90.7 kg)   SpO2 97%   BMI 27.12 kg/m     Wt Readings from Last 3 Encounters:  06/23/24 200 lb (90.7 kg)  05/27/24 201 lb (91.2 kg)  05/06/24 201 lb 12.8 oz (91.5 kg)     GEN: He does not look chronically ill or debilitated well nourished, well developed in no acute distress HEENT: Normal NECK: No JVD; No carotid bruits LYMPHATICS: No lymphadenopathy CARDIAC: RRR, no murmurs, rubs, gallops RESPIRATORY:  Clear to auscultation without rales, wheezing or rhonchi he has no fibrotic rales ABDOMEN: Soft, non-tender, non-distended MUSCULOSKELETAL:  No edema; No deformity  SKIN: Warm and dry NEUROLOGIC:  Alert and oriented x 3 PSYCHIATRIC:  Normal affect    Signed, Charles Leiter, MD  06/23/2024 8:10 AM    [facility]

## 2024-06-23 ENCOUNTER — Encounter: Payer: Self-pay | Admitting: Cardiology

## 2024-06-23 ENCOUNTER — Ambulatory Visit: Attending: Cardiology | Admitting: Cardiology

## 2024-06-23 VITALS — BP 130/60 | HR 61 | Ht 72.0 in | Wt 200.0 lb

## 2024-06-23 DIAGNOSIS — I25118 Atherosclerotic heart disease of native coronary artery with other forms of angina pectoris: Secondary | ICD-10-CM | POA: Diagnosis not present

## 2024-06-23 DIAGNOSIS — Z950 Presence of cardiac pacemaker: Secondary | ICD-10-CM

## 2024-06-23 DIAGNOSIS — I739 Peripheral vascular disease, unspecified: Secondary | ICD-10-CM | POA: Diagnosis not present

## 2024-06-23 DIAGNOSIS — I471 Supraventricular tachycardia, unspecified: Secondary | ICD-10-CM

## 2024-06-23 DIAGNOSIS — I119 Hypertensive heart disease without heart failure: Secondary | ICD-10-CM | POA: Diagnosis not present

## 2024-06-23 DIAGNOSIS — E782 Mixed hyperlipidemia: Secondary | ICD-10-CM

## 2024-06-23 NOTE — Patient Instructions (Signed)
 Medication Instructions:  Your physician recommends that you continue on your current medications as directed. Please refer to the Current Medication list given to you today.  *If you need a refill on your cardiac medications before your next appointment, please call your pharmacy*  Lab Work: NONE If you have labs (blood work) drawn today and your tests are completely normal, you will receive your results only by: MyChart Message (if you have MyChart) OR A paper copy in the mail If you have any lab test that is abnormal or we need to change your treatment, we will call you to review the results.  Testing/Procedures: Chest CT and PFT's at the [facility]  Follow-Up: At [facility], you and your health needs are our priority.  As part of our continuing mission to provide you with exceptional heart care, our providers are all part of one team.  This team includes your primary Cardiologist (physician) and Advanced Practice Providers or APPs (Physician Assistants and Nurse Practitioners) who all work together to provide you with the care you need, when you need it.  Your next appointment:   6 month(s)  Provider:   Redell Leiter, MD    We recommend signing up for the patient portal called MyChart.  Sign up information is provided on this After Visit Summary.  MyChart is used to connect with patients for Virtual Visits (Telemedicine).  Patients are able to view lab/test results, encounter notes, upcoming appointments, etc.  Non-urgent messages can be sent to your provider as well.   To learn more about what you can do with MyChart, go to [email or web address].   Other Instructions

## 2024-08-26 DIAGNOSIS — I259 Chronic ischemic heart disease, unspecified: Secondary | ICD-10-CM | POA: Insufficient documentation

## 2024-08-27 ENCOUNTER — Telehealth: Payer: Self-pay | Admitting: Cardiology

## 2024-08-27 NOTE — Telephone Encounter (Signed)
 Patient dropped off a drawing of a heart that the [facility] gave him showing him 4 blockages that are forming in his heart that he is very concerned about after having an exploratory procedure there at the [facility]. I put the drawing/diagram in Dr. Jae mailbox upfront. He would like a call asap if possible. CB # [phone]

## 2024-08-28 NOTE — Telephone Encounter (Signed)
 Picture has been placed in your folder.

## 2024-08-28 NOTE — Progress Notes (Signed)
 " Cardiology Office Note:    Date:  08/29/2024   ID:  Charles Gonzalez, DOB [birth year], MRN [id number]  PCP:  [facility]  Cardiologist:  Redell Leiter, MD    Referring MD: [facility]    ASSESSMENT:    1. Coronary artery disease of native artery of native heart with stable angina pectoris   2. Hypertensive heart disease without heart failure   3. Mixed hyperlipidemia   4. Statin intolerance   5. PSVT (paroxysmal supraventricular tachycardia)   6. Pacemaker   7. PAD (peripheral artery disease)    PLAN:    In order of problems listed above:  A bit confusing initially I thought he is here for me to arrange for him to have PCI and stent it is already arranged I reviewed his records told him I thought he had good care and I agreed with the recommendation he is reassured Stable hypertension continue ACE inhibitor Currently not on lipid-lowering therapy he is statin intolerant I will leave this to the interventional cardiologist protocol at [facility] for treatment No recurrence continue sotalol  Stable pacemaker function Stable CAD continue dual antiplatelet therapy   Next appointment:    Medication Adjustments/Labs and Tests Ordered: Current medicines are reviewed at length with the patient today.  Concerns regarding medicines are outlined above.  No orders of the defined types were placed in this encounter.  No orders of the defined types were placed in this encounter.    History of Present Illness:    Charles Gonzalez is a 74 y.o. male with a hx of CAD remote PCI of the right coronary artery 1990 SVT with EP catheter ablation 2017 right bundle branch block dyslipidemia statin intolerance hypertensive heart disease peripheral vascular disease PCI to left common iliac artery decades ago pacemaker for symptomatic bradycardia followed through device clinic [facility].  Last seen 08/24/2023.  He alerted my office that he had a diagnostic left heart  catheterization performed at the [facility] I was able to review the formal report he had subtotal occlusion of the mid right coronary artery and was advised PCI at a tertiary referral system.  When last seen by me he was having exercise intolerance and exertional shortness of breath that I thought was most consistent with anginal equivalent we discussed referral to left heart catheterization but he declined and he wanted to pursue a pulmonary evaluation first.  He had a myocardial perfusion study reported 05/27/2024 showed an ejection fraction mildly reduced to low normal at 51% and normal myocardial perfusion. BNP level was mid range 547 Echocardiogram showed moderate LVH grade 1 diastolic dysfunction EF 55 to 39% with normal GLS mitral regurgitation.  I reviewed the echo report left ventricular diastolic filling pressure is normal.  Compliance with diet, lifestyle and medications: Yes  I thought he was coming today to see me for referral for PCI and stent after he came to the office made an appointment he received a call and is scheduled [facility] PCI and stent 09/08/2024 He still has exercise intolerance exertional shortness of breath no chest pain He was placed on oral Isordil which has caused terrible headache and he stopped He is clinically stable Past Medical History:  Diagnosis Date   Acute kidney failure with acute cortical necrosis 04/25/2017   Arthritis 04/25/2017   Benign hypertension 10/06/2016   C. difficile colitis 04/25/2017   02/2016   CAD (coronary artery disease), native coronary artery 04/19/2015   Overview:  PCI of RCA  1995   Cirrhosis (HCC) 04/25/2017   Essential hypertension 04/25/2017   Gastric ulcer 04/25/2017   High risk medication use 03/01/2016   Overview:  sotolol   Hyperlipidemia 04/19/2015   Hypertension 04/25/2017   Mixed hyperlipidemia 10/06/2016   Overview:  HIS indicated based on CHD, declines statin, felt bad, declines [id number]    Obesity 04/25/2017   Old MI (myocardial infarction) 04/03/1994   Pacemaker 06/08/2015   Overview:  St Jude   Pacemaker reprogramming/check 06/28/2015   PAD (peripheral artery disease) 04/19/2015   Overview:  PCI of L CIA 2001   Polyneuropathy in diabetes (HCC) 04/25/2017   Presence of permanent cardiac pacemaker 06/14/2015   Procedure and treatment not carried out due to patient leaving prior to being seen by health care provider 05/05/2024   PSVT (paroxysmal supraventricular tachycardia) 04/19/2015   S/P ablation of accessory bypass tract 06/09/2015   S/P coronary artery stent placement 04/25/2017   Overview:  Multiple   Shortness of breath 04/25/2017   Overview:  exertional and with tachycardia   Sinus bradycardia 11/23/2023   Transient complete heart block (HCC) 06/28/2015   Overview:  pacemaker   Type 2 diabetes mellitus without complication, without long-term current use of insulin (HCC) 05/02/2015   Overview:  1998: dx   Unintended weight loss 12/31/2023   Upper back pain 11/23/2023    Current Medications: Active Medications[1]    EKGs/Labs/Other Studies Reviewed:    The following studies were reviewed today:  Cardiac Studies & Procedures   ______________________________________________________________________________________________   STRESS TESTS  MYOCARDIAL PERFUSION IMAGING 05/27/2024  Interpretation Summary   Findings are consistent with no ischemia and no infarction. The study is intermediate risk secondary to mildly diminished EF.   Left ventricular function is abnormal. Global function is mildly reduced. Nuclear stress EF: 51%. The left ventricular ejection fraction is mildly decreased (45-54%). End diastolic cavity size is normal.   Prior study available for comparison from 09/14/2020.   ECHOCARDIOGRAM  ECHOCARDIOGRAM COMPLETE 05/28/2024  Narrative ECHOCARDIOGRAM REPORT    Patient Name:   Charles Gonzalez Date of Exam: 05/28/2024 Medical Rec #:   [id number]    Height:       72.0 in Accession #:    [id number]   Weight:       201.0 lb Date of Birth:  [birth year]     BSA:          2.135 m Patient Age:    74 years     BP:           138/54 mmHg Patient Gender: M            HR:           62 bpm. Exam Location:  [facility]  Procedure: 2D Echo, Cardiac Doppler, Color Doppler and Strain Analysis (Both Spectral and Color Flow Doppler were utilized during procedure).  Indications:    Elevated brain natriuretic peptide (BNP) level [R79.89 (ICD-10-CM)]  History:        Patient has prior history of Echocardiogram examinations, most recent 12/10/2023. Previous Myocardial Infarction and CAD, Pacemaker, PAD, Arrythmias:Tachycardia and Bradycardia, Signs/Symptoms:Shortness of Breath; Risk Factors:Hypertension, Dyslipidemia and Diabetes. Prior echo- 50-55% EF.  Sonographer:    Saddie Chimes Referring Phys: [id number] Philo Kurtz J Alexias Margerum  IMPRESSIONS   1. Left ventricular ejection fraction, by estimation, is 55 to 60%. The left ventricle has normal function. The left ventricle demonstrates regional wall motion abnormalities (see scoring diagram/findings for description). There is moderate concentric left ventricular hypertrophy. Left ventricular diastolic  parameters are consistent with Grade I diastolic dysfunction (impaired relaxation). The average left ventricular global longitudinal strain is 19.5 %. The global longitudinal strain is normal. 2. Right ventricular systolic function is normal. The right ventricular size is mildly enlarged. There is normal pulmonary artery systolic pressure. 3. The mitral valve is degenerative. Mild mitral valve regurgitation. No evidence of mitral stenosis. 4. The aortic valve is tricuspid. Aortic valve regurgitation is not visualized. Aortic valve sclerosis is present, with no evidence of aortic valve stenosis. 5. The inferior vena cava is normal in size with greater than 50% respiratory variability, suggesting right atrial  pressure of 3 mmHg.  FINDINGS Left Ventricle: Left ventricular ejection fraction, by estimation, is 55 to 60%. The left ventricle has normal function. The left ventricle demonstrates regional wall motion abnormalities. The average left ventricular global longitudinal strain is 19.5 %. Strain was performed and the global longitudinal strain is normal. The left ventricular internal cavity size was normal in size. There is moderate concentric left ventricular hypertrophy. Left ventricular diastolic parameters are consistent with Grade I diastolic dysfunction (impaired relaxation). Normal left ventricular filling pressure.   LV Wall Scoring: The basal inferior segment is hypokinetic. The entire anterior wall, entire lateral wall, entire septum, entire apex, and mid and distal inferior wall are normal.  Right Ventricle: The right ventricular size is mildly enlarged. No increase in right ventricular wall thickness. Right ventricular systolic function is normal. There is normal pulmonary artery systolic pressure. The tricuspid regurgitant velocity is 2.41 m/s, and with an assumed right atrial pressure of 3 mmHg, the estimated right ventricular systolic pressure is 26.2 mmHg.  Left [facility]: Left atrial size was normal in size.  Right [facility]: Right atrial size was normal in size.  Pericardium: There is no evidence of pericardial effusion.  Mitral Valve: The mitral valve is degenerative in appearance. Mild mitral annular calcification. Mild mitral valve regurgitation. No evidence of mitral valve stenosis. MV peak gradient, 4.2 mmHg. The mean mitral valve gradient is 2.0 mmHg.  Tricuspid Valve: The tricuspid valve is normal in structure. Tricuspid valve regurgitation is mild . No evidence of tricuspid stenosis.  Aortic Valve: The aortic valve is tricuspid. Aortic valve regurgitation is not visualized. Aortic valve sclerosis is present, with no evidence of aortic valve stenosis. Aortic valve mean  gradient measures 3.7 mmHg. Aortic valve peak gradient measures 6.8 mmHg. Aortic valve area, by VTI measures 2.39 cm.  Pulmonic Valve: The pulmonic valve was normal in structure. Pulmonic valve regurgitation is not visualized. No evidence of pulmonic stenosis.  Aorta: The aortic root is normal in size and structure, the ascending aorta was not well visualized and the aortic arch was not well visualized.  Venous: The pulmonary veins were not well visualized. The inferior vena cava is normal in size with greater than 50% respiratory variability, suggesting right atrial pressure of 3 mmHg.  IAS/Shunts: No atrial level shunt detected by color flow Doppler.  Additional Comments: A device lead is visualized.   LEFT VENTRICLE PLAX 2D LVIDd:         5.40 cm   Diastology LVIDs:         3.90 cm   LV e' medial:    7.51 cm/s LV PW:         1.30 cm   LV E/e' medial:  9.3 LV IVS:        1.60 cm   LV e' lateral:   8.49 cm/s LVOT diam:     2.40 cm  LV E/e' lateral: 8.3 LV SV:         79 LV SV Index:   37        2D Longitudinal Strain LVOT Area:     4.52 cm  2D Strain GLS Avg:     19.5 %   RIGHT VENTRICLE RV Basal diam:  4.60 cm RV Mid diam:    3.50 cm TAPSE (M-mode): 2.6 cm  LEFT ATRIUM           Index LA diam:      3.60 cm 1.69 cm/m LA Vol (A4C): 35.8 ml 16.77 ml/m AORTIC VALVE                    PULMONIC VALVE AV Area (Vmax):    2.27 cm     PV Vmax:       0.75 m/s AV Area (Vmean):   2.28 cm     PV Vmean:      55.000 cm/s AV Area (VTI):     2.39 cm     PV VTI:        0.196 m AV Vmax:           130.33 cm/s  PV Peak grad:  2.2 mmHg AV Vmean:          88.067 cm/s  PV Mean grad:  1.0 mmHg AV VTI:            0.331 m AV Peak Grad:      6.8 mmHg AV Mean Grad:      3.7 mmHg LVOT Vmax:         65.45 cm/s LVOT Vmean:        44.450 cm/s LVOT VTI:          0.174 m LVOT/AV VTI ratio: 0.53  AORTA Ao Asc diam: 3.20 cm  MITRAL VALVE                TRICUSPID VALVE MV Area (PHT): 2.31 cm      TR Peak grad:   23.2 mmHg MV Area VTI:   1.43 cm     TR Vmax:        241.00 cm/s MV Peak grad:  4.2 mmHg MV Mean grad:  2.0 mmHg     SHUNTS MV Vmax:       1.02 m/s     Systemic VTI:  0.17 m MV Vmean:      60.9 cm/s    Systemic Diam: 2.40 cm MV Decel Time: 329 msec MR Peak grad: 28.7 mmHg MR Vmax:      268.00 cm/s MV E velocity: 70.20 cm/s MV A velocity: 102.00 cm/s MV E/A ratio:  0.69  Redell Leiter MD Electronically signed by Redell Leiter MD Signature Date/Time: 05/28/2024/4:49:48 PM    Final          ______________________________________________________________________________________________          Recent Labs: 11/13/2023: BUN 13; Creatinine, Ser 0.97; Potassium 5.2; Sodium 142 05/06/2024: NT-Pro BNP 547  Recent Lipid Panel    Component Value Date/Time   CHOL 194 06/18/2017 1000   TRIG 241 (H) 06/18/2017 1000   HDL 37 (L) 06/18/2017 1000   CHOLHDL 5.2 (H) 06/18/2017 1000   LDLCALC 109 (H) 06/18/2017 1000    Physical Exam:    VS:  BP 124/62   Pulse 76   Ht 6' (1.829 m)   Wt 195 lb 12.8 oz (88.8 kg)   SpO2 97%   BMI 26.56 kg/m  Wt Readings from Last 3 Encounters:  08/29/24 195 lb 12.8 oz (88.8 kg)  06/23/24 200 lb (90.7 kg)  05/27/24 201 lb (91.2 kg)     GEN:  Well nourished, well developed in no acute distress HEENT: Normal NECK: No JVD; No carotid bruits LYMPHATICS: No lymphadenopathy CARDIAC: RRR, no murmurs, rubs, gallops RESPIRATORY:  Clear to auscultation without rales, wheezing or rhonchi  ABDOMEN: Soft, non-tender, non-distended MUSCULOSKELETAL:  No edema; No deformity  SKIN: Warm and dry NEUROLOGIC:  Alert and oriented x 3 PSYCHIATRIC:  Normal affect    Signed, Redell Leiter, MD  08/29/2024 4:17 PM    [facility]      [1]  Current Meds  Medication Sig   aspirin EC 81 MG tablet Take 81 mg by mouth daily.   clopidogrel  (PLAVIX ) 75 MG tablet TAKE ONE TABLET BY MOUTH EVERY DAY   colchicine 0.6 MG  tablet Take 0.6 mg by mouth as needed (for gout flare ups).   lisinopril  (ZESTRIL ) 10 MG tablet TAKE ONE TABLET BY MOUTH EVERY DAY   metFORMIN (GLUCOPHAGE) 500 MG tablet Take 500 mg by mouth daily with breakfast.   Multiple Vitamins-Minerals (PRESERVISION AREDS 2+MULTI VIT) CAPS Take 1 capsule by mouth 2 (two) times daily.   nitroGLYCERIN  (NITROSTAT ) 0.4 MG SL tablet Place 1 tablet (0.4 mg total) under the tongue every 5 (five) minutes as needed for chest pain.   sotalol  (BETAPACE ) 80 MG tablet TAKE ONE TABLET BY MOUTH TWICE DAILY   "

## 2024-08-28 NOTE — Telephone Encounter (Signed)
 Appointment scheduled with [facility].

## 2024-08-29 ENCOUNTER — Ambulatory Visit: Attending: Cardiology | Admitting: Cardiology

## 2024-08-29 VITALS — BP 124/62 | HR 76 | Ht 72.0 in | Wt 195.8 lb

## 2024-08-29 DIAGNOSIS — Z77128 Contact with and (suspected) exposure to other hazards in the physical environment: Secondary | ICD-10-CM | POA: Insufficient documentation

## 2024-08-29 DIAGNOSIS — Z950 Presence of cardiac pacemaker: Secondary | ICD-10-CM | POA: Diagnosis not present

## 2024-08-29 DIAGNOSIS — E782 Mixed hyperlipidemia: Secondary | ICD-10-CM | POA: Diagnosis not present

## 2024-08-29 DIAGNOSIS — Z789 Other specified health status: Secondary | ICD-10-CM | POA: Diagnosis not present

## 2024-08-29 DIAGNOSIS — I25118 Atherosclerotic heart disease of native coronary artery with other forms of angina pectoris: Secondary | ICD-10-CM | POA: Diagnosis not present

## 2024-08-29 DIAGNOSIS — I119 Hypertensive heart disease without heart failure: Secondary | ICD-10-CM

## 2024-08-29 DIAGNOSIS — I471 Supraventricular tachycardia, unspecified: Secondary | ICD-10-CM

## 2024-08-29 DIAGNOSIS — I739 Peripheral vascular disease, unspecified: Secondary | ICD-10-CM

## 2024-08-29 NOTE — Patient Instructions (Signed)
 Medication Instructions:  Your physician recommends that you continue on your current medications as directed. Please refer to the Current Medication list given to you today.  *If you need a refill on your cardiac medications before your next appointment, please call your pharmacy*  Lab Work: None If you have labs (blood work) drawn today and your tests are completely normal, you will receive your results only by: MyChart Message (if you have MyChart) OR A paper copy in the mail If you have any lab test that is abnormal or we need to change your treatment, we will call you to review the results.  Testing/Procedures: None  Follow-Up: At [facility], you and your health needs are our priority.  As part of our continuing mission to provide you with exceptional heart care, our providers are all part of one team.  This team includes your primary Cardiologist (physician) and Advanced Practice Providers or APPs (Physician Assistants and Nurse Practitioners) who all work together to provide you with the care you need, when you need it.  Your next appointment:   4 week(s)  Provider:   Redell Leiter, MD    We recommend signing up for the patient portal called MyChart.  Sign up information is provided on this After Visit Summary.  MyChart is used to connect with patients for Virtual Visits (Telemedicine).  Patients are able to view lab/test results, encounter notes, upcoming appointments, etc.  Non-urgent messages can be sent to your provider as well.   To learn more about what you can do with MyChart, go to [email or web address].   Other Instructions None

## 2024-09-19 ENCOUNTER — Ambulatory Visit: Admitting: Cardiology

## 2024-10-31 ENCOUNTER — Ambulatory Visit: Admitting: Cardiology
